# Patient Record
Sex: Female | Born: 1995
Health system: Southern US, Community
[De-identification: ages and names within clinical notes are randomized; demographics above are authoritative.]

## PROBLEM LIST (undated history)

## (undated) DIAGNOSIS — L309 Dermatitis, unspecified: Secondary | ICD-10-CM

## (undated) DIAGNOSIS — R0902 Hypoxemia: Secondary | ICD-10-CM

## (undated) HISTORY — DX: Dermatitis, unspecified: L30.9

## (undated) HISTORY — PX: WISDOM TOOTH EXTRACTION: SHX21

## (undated) HISTORY — DX: Hypoxemia: R09.02

---

## 2017-05-16 ENCOUNTER — Ambulatory Visit
Admission: EM | Admit: 2017-05-16 | Discharge: 2017-05-16 | Disposition: A | Discharge status: Home / Self Care | Payer: BLUE CROSS/BLUE SHIELD | Attending: Family Medicine | Admitting: Family Medicine

## 2017-05-16 ENCOUNTER — Encounter: Payer: Self-pay | Admitting: Emergency Medicine

## 2017-05-16 DIAGNOSIS — R05 Cough: Secondary | ICD-10-CM | POA: Diagnosis not present

## 2017-05-16 DIAGNOSIS — J0100 Acute maxillary sinusitis, unspecified: Secondary | ICD-10-CM | POA: Diagnosis not present

## 2017-05-16 MED ORDER — AMOXICILLIN 875 MG PO TABS: 875.0000 mg | ORAL_TABLET | Freq: Two times a day (BID) | ORAL | 0 refills | Status: DC

## 2017-05-16 NOTE — ED Triage Notes (Signed)
Patient c/o runny nose, cough, congestion and HAs since Monday.

## 2017-05-16 NOTE — ED Provider Notes (Signed)
[facility]    CSN: [id number] Arrival date & time: 05/16/17  1707     History   Chief Complaint Chief Complaint  Patient presents with  . Cough  . Nasal Congestion  . Headache    HPI Denise Tate is a 21 y.o. female.   The history is provided by the patient.  URI  Presenting symptoms: congestion, cough and facial pain   Severity:  Moderate Onset quality:  Sudden Duration:  1 week Timing:  Constant Progression:  Worsening Chronicity:  New Relieved by:  Nothing Ineffective treatments:  OTC medications Associated symptoms: headaches and sinus pain   Associated symptoms: no wheezing   Risk factors: sick contacts   Risk factors: not elderly, no chronic cardiac disease, no chronic kidney disease, no chronic respiratory disease, no diabetes mellitus, no immunosuppression, no recent illness and no recent travel     History reviewed. No pertinent past medical history.  There are no active problems to display for this patient.   History reviewed. No pertinent surgical history.  OB History    No data available       Home Medications    Prior to Admission medications   Medication Sig Start Date End Date Taking? Authorizing Provider  amoxicillin (AMOXIL) 875 MG tablet Take 1 tablet (875 mg total) by mouth 2 (two) times daily. 05/16/17   Payton Mccallum, MD    Family History History reviewed. No pertinent family history.  Social History Social History  Substance Use Topics  . Smoking status: Never Smoker  . Smokeless tobacco: Never Used  . Alcohol use No     Allergies   Patient has no known allergies.   Review of Systems Review of Systems  HENT: Positive for congestion and sinus pain.   Respiratory: Positive for cough. Negative for wheezing.   Neurological: Positive for headaches.     Physical Exam Triage Vital Signs ED Triage Vitals  Enc Vitals Group     BP 05/16/17 1721 102/61     Pulse Rate 05/16/17 1721 79     Resp 05/16/17 1721  14     Temp 05/16/17 1721 98.1 F (36.7 C)     Temp Source 05/16/17 1721 Oral     SpO2 05/16/17 1721 100 %     Weight 05/16/17 1719 100 lb (45.4 kg)     Height 05/16/17 1719 5' (1.524 m)     Head Circumference --      Peak Flow --      Pain Score 05/16/17 1719 0     Pain Loc --      Pain Edu? --      Excl. in GC? --    No data found.   Updated Vital Signs BP 102/61 (BP Location: Left Arm)   Pulse 79   Temp 98.1 F (36.7 C) (Oral)   Resp 14   Ht 5' (1.524 m)   Wt 100 lb (45.4 kg)   LMP 04/26/2017 (Exact Date)   SpO2 100%   BMI 19.53 kg/m   Visual Acuity Right Eye Distance:   Left Eye Distance:   Bilateral Distance:    Right Eye Near:   Left Eye Near:    Bilateral Near:     Physical Exam  Constitutional: She appears well-developed and well-nourished. No distress.  HENT:  Head: Normocephalic and atraumatic.  Right Ear: Tympanic membrane, external ear and ear canal normal.  Left Ear: Tympanic membrane, external ear and ear canal normal.  Nose: Mucosal edema  and rhinorrhea present. No nose lacerations, sinus tenderness, nasal deformity, septal deviation or nasal septal hematoma. No epistaxis.  No foreign bodies. Right sinus exhibits maxillary sinus tenderness and frontal sinus tenderness. Left sinus exhibits maxillary sinus tenderness and frontal sinus tenderness.  Mouth/Throat: Uvula is midline, oropharynx is clear and moist and mucous membranes are normal. No oropharyngeal exudate.  Eyes: Pupils are equal, round, and reactive to light. Conjunctivae and EOM are normal. Right eye exhibits no discharge. Left eye exhibits no discharge. No scleral icterus.  Neck: Normal range of motion. Neck supple. No thyromegaly present.  Cardiovascular: Normal rate, regular rhythm and normal heart sounds.   Pulmonary/Chest: Effort normal and breath sounds normal. No respiratory distress. She has no wheezes. She has no rales.  Lymphadenopathy:    She has no cervical adenopathy.  Skin:  She is not diaphoretic.  Nursing note and vitals reviewed.    UC Treatments / Results  Labs (all labs ordered are listed, but only abnormal results are displayed) Labs Reviewed - No data to display  EKG  EKG Interpretation None       Radiology No results found.  Procedures Procedures (including critical care time)  Medications Ordered in UC Medications - No data to display   Initial Impression / Assessment and Plan / UC Course  I have reviewed the triage vital signs and the nursing notes.  Pertinent labs & imaging results that were available during my care of the patient were reviewed by me and considered in my medical decision making (see chart for details).       Final Clinical Impressions(s) / UC Diagnoses   Final diagnoses:  Acute maxillary sinusitis, recurrence not specified    New Prescriptions Discharge Medication List as of 05/16/2017  5:53 PM    START taking these medications   Details  amoxicillin (AMOXIL) 875 MG tablet Take 1 tablet (875 mg total) by mouth 2 (two) times daily., Starting Fri 05/16/2017, Normal       1. diagnosis reviewed with patient 2. rx as per orders above; reviewed possible side effects, interactions, risks and benefits  3. Recommend supportive treatment with otc flonase 4. Follow-up prn if symptoms worsen or don't improve  Controlled Substance Prescriptions [place] Controlled Substance Registry consulted? Not Applicable   Payton Mccallum, MD 05/16/17 [phone]

## 2017-05-19 ENCOUNTER — Ambulatory Visit
Admission: EM | Admit: 2017-05-19 | Discharge: 2017-05-19 | Disposition: A | Discharge status: Home / Self Care | Payer: BLUE CROSS/BLUE SHIELD | Attending: Family Medicine | Admitting: Family Medicine

## 2017-05-19 ENCOUNTER — Encounter: Payer: Self-pay | Admitting: *Deleted

## 2017-05-19 DIAGNOSIS — J0100 Acute maxillary sinusitis, unspecified: Secondary | ICD-10-CM

## 2017-05-19 DIAGNOSIS — J069 Acute upper respiratory infection, unspecified: Secondary | ICD-10-CM

## 2017-05-19 NOTE — ED Triage Notes (Signed)
Patient is still having symptoms of congestion with the addition of cough since last visit on 05/16/17. Symptoms have become worse.

## 2017-05-19 NOTE — ED Provider Notes (Signed)
[facility] ____________________________________________  Time seen: Approximately 11:55 AM  I have reviewed the triage vital signs and the nursing notes.   HISTORY  Chief Complaint Cough   HPI Denise Tate is a 21 y.o. female presenting for evaluation of cough and congestion. Patient reports that she was seen in urgent care 3 days ago for the same complaint and was started on oral amoxicillin for sinusitis. Patient reports since that time she started to have more of cough and congestion continues. States still has some sinus pressure discomfort. Denies fevers, chest pain, shortness of breath, abdominal discomfort, rash or sore throat. Reports continues to eat and drink well. States total sick timeframe in one week. Has not been taking any over-the-counter medications in addition to amoxicillin. Denies other aggravating or alleviating factors.  Denies chest pain, shortness of breath, abdominal pain, dysuria,  or rash. Denies recent sickness. Denies recent antibiotic use.   Patient's last menstrual period was 04/26/2017 (exact date).Denies pregnancy.  Herb Grays, MD: PCP   History reviewed. No pertinent past medical history. denies There are no active problems to display for this patient.   History reviewed. No pertinent surgical history.   No current facility-administered medications for this encounter.   Current Outpatient Prescriptions:  .  amoxicillin (AMOXIL) 875 MG tablet, Take 1 tablet (875 mg total) by mouth 2 (two) times daily., Disp: 14 tablet, Rfl: 0  Allergies Patient has no known allergies.  History reviewed. No pertinent family history.  Social History Social History  Substance Use Topics  . Smoking status: Never Smoker  . Smokeless tobacco: Never Used  . Alcohol use No    Review of Systems Constitutional: No fever/chills Eyes: No visual changes. ENT: No sore throat. Cardiovascular: Denies chest pain. Respiratory: Denies shortness of  breath. Gastrointestinal: No abdominal pain.   Genitourinary: Negative for dysuria. Musculoskeletal: Negative for back pain. Skin: Negative for rash.   ____________________________________________   PHYSICAL EXAM:  VITAL SIGNS: ED Triage Vitals [05/19/17 1120]  Enc Vitals Group     BP (!) 103/50     Pulse Rate 84     Resp 16     Temp 98.4 F (36.9 C)     Temp Source Oral     SpO2 100 %     Weight      Height      Head Circumference      Peak Flow      Pain Score 4     Pain Loc      Pain Edu?      Excl. in GC?    Constitutional: Alert and oriented. Well appearing and in no acute distress. Eyes: Conjunctivae are normal.  Head: Atraumatic.Mild tenderness to palpation bilateral maxillary sinuses.No frontal sinus palpation.No swelling. No erythema.   Ears: no erythema, normal TMs bilaterally.   Nose: nasal congestion with bilateral nasal turbinate erythema and edema.   Mouth/Throat: Mucous membranes are moist.  Oropharynx non-erythematous.No tonsillar swelling or exudate.  Neck: No stridor.  No cervical spine tenderness to palpation. Hematological/Lymphatic/Immunilogical: No cervical lymphadenopathy. Cardiovascular: Normal rate, regular rhythm. Grossly normal heart sounds.  Good peripheral circulation. Respiratory: Normal respiratory effort.  No retractions. No wheezes, rales or rhonchi. Good air movement.  Musculoskeletal:  No cervical, thoracic or lumbar tenderness to palpation.  Neurologic:  Normal speech and language.  No gait instability. Skin:  Skin is warm, dry  Psychiatric: Mood and affect are normal. Speech and behavior are normal.  ___________________________________________   LABS (all labs ordered are  listed, but only abnormal results are displayed)  Labs Reviewed - No data to display ____________________________________________  PROCEDURES Procedures    INITIAL IMPRESSION / ASSESSMENT AND PLAN / ED COURSE  Pertinent labs & imaging results that were  available during my care of the patient were reviewed by me and considered in my medical decision making (see chart for details).  Well appearing patient. No acute distress. Discussed in detail with patient suspect viral upper respiratory infection as patient now with coughing and chest congestion. Lungs clear throughout. Encouraged patient to continue home amoxicillin prescribed for previous visit for sinusitis, patient taking over-the-counter cough and congestion medications, denies prescription for cough, states she'll take DayQuil. Encourage rest, fluids and supportive care.  Discussed follow up with Primary care physician this week. Discussed follow up and return parameters including no resolution or any worsening concerns. Patient verbalized understanding and agreed to plan.   ____________________________________________   FINAL CLINICAL IMPRESSION(S) / ED DIAGNOSES  Final diagnoses:  Upper respiratory tract infection, unspecified type  Acute maxillary sinusitis, recurrence not specified     Discharge Medication List as of 05/19/2017 12:03 PM      Note: This dictation was prepared with Dragon dictation along with smaller phrase technology. Any transcriptional errors that result from this process are unintentional.         Renford Dills, NP 05/19/17 1350

## 2017-05-19 NOTE — Discharge Instructions (Signed)
Take medication as prescribed. Rest. Drink plenty of fluids.  ° °Follow up with your primary care physician this week as needed. Return to Urgent care for new or worsening concerns.  ° °

## 2017-06-23 ENCOUNTER — Ambulatory Visit (INDEPENDENT_AMBULATORY_CARE_PROVIDER_SITE_OTHER): Payer: BLUE CROSS/BLUE SHIELD | Admitting: Family Medicine

## 2017-06-23 ENCOUNTER — Encounter: Payer: Self-pay | Admitting: Family Medicine

## 2017-06-23 VITALS — BP 101/64 | HR 96 | Temp 98.4°F | Ht 61.0 in | Wt 99.4 lb

## 2017-06-23 DIAGNOSIS — L309 Dermatitis, unspecified: Secondary | ICD-10-CM

## 2017-06-23 DIAGNOSIS — R0789 Other chest pain: Secondary | ICD-10-CM | POA: Diagnosis not present

## 2017-06-23 DIAGNOSIS — J4520 Mild intermittent asthma, uncomplicated: Secondary | ICD-10-CM

## 2017-06-23 DIAGNOSIS — B373 Candidiasis of vulva and vagina: Secondary | ICD-10-CM | POA: Diagnosis not present

## 2017-06-23 DIAGNOSIS — R140 Abdominal distension (gaseous): Secondary | ICD-10-CM | POA: Diagnosis not present

## 2017-06-23 DIAGNOSIS — Z7689 Persons encountering health services in other specified circumstances: Secondary | ICD-10-CM | POA: Diagnosis not present

## 2017-06-23 DIAGNOSIS — J45909 Unspecified asthma, uncomplicated: Secondary | ICD-10-CM | POA: Insufficient documentation

## 2017-06-23 DIAGNOSIS — B3731 Acute candidiasis of vulva and vagina: Secondary | ICD-10-CM

## 2017-06-23 MED ORDER — ALBUTEROL SULFATE (2.5 MG/3ML) 0.083% IN NEBU
2.5000 mg | INHALATION_SOLUTION | Freq: Once | RESPIRATORY_TRACT | Status: AC
  Administered 2017-06-23: 2.5 mg via RESPIRATORY_TRACT

## 2017-06-23 MED ORDER — FLUCONAZOLE 150 MG PO TABS: 150.0000 mg | ORAL_TABLET | Freq: Once | ORAL | 0 refills | Status: AC

## 2017-06-23 MED ORDER — ALBUTEROL SULFATE HFA 108 (90 BASE) MCG/ACT IN AERS: 2.0000 | INHALATION_SPRAY | Freq: Four times a day (QID) | RESPIRATORY_TRACT | 3 refills | Status: DC | PRN

## 2017-06-23 NOTE — Progress Notes (Signed)
BP 101/64 (BP Location: Right Arm, Patient Position: Sitting, Cuff Size: Small)   Pulse 96   Temp 98.4 F (36.9 C)   Ht 5\' 1"  (1.549 m)   Wt 99 lb 6.4 oz (45.1 kg)   SpO2 99%   BMI 18.78 kg/m    Subjective:    Patient ID: Denise Tate, female    DOB: 1995/12/07, 21 y.o.   MRN: [id number]  HPI: Denise Tate is a 21 y.o. female  Chief Complaint  Patient presents with  . Establish Care  . Vaginal Discharge    Started Thursday. Patient was taking Amoxicillan.   . Vaginal Itching   Patient presents today to establish care. Has several concerns today.   Recently treated with amoxicillin for URI, now having several days of vaginal discharge and itching. Frequently gets yeast infections with abx. Has tried some monistat with no relief.   Has always had chest tightness, father has asthma but she has never been tested. Not aware of any seasonal allergies. Has never tried anything for her sxs.   Does have severe eczema for which she is followed by dermatology regularly. On several topical creams for this. Not currently doing a moisturizing regimen.   Also experiencing some gassiness and abdominal bloating, worse after eating.   Past Medical History:  Diagnosis Date  . Eczema    Social History   Social History  . Marital status: Single    Spouse name: N/A  . Number of children: N/A  . Years of education: N/A   Occupational History  . Not on file.   Social History Main Topics  . Smoking status: Never Smoker  . Smokeless tobacco: Never Used  . Alcohol use No  . Drug use: No  . Sexual activity: Not on file   Other Topics Concern  . Not on file   Social History Narrative  . No narrative on file    Relevant past medical, surgical, family and social history reviewed and updated as indicated. Interim medical history since our last visit reviewed. Allergies and medications reviewed and updated.  Review of Systems  Constitutional: Negative.   HENT: Negative.     Respiratory: Positive for chest tightness.   Cardiovascular: Negative.   Gastrointestinal: Positive for abdominal pain.  Genitourinary: Positive for vaginal discharge.  Musculoskeletal: Negative.   Neurological: Negative.   Psychiatric/Behavioral: Negative.    Per HPI unless specifically indicated above     Objective:    BP 101/64 (BP Location: Right Arm, Patient Position: Sitting, Cuff Size: Small)   Pulse 96   Temp 98.4 F (36.9 C)   Ht 5\' 1"  (1.549 m)   Wt 99 lb 6.4 oz (45.1 kg)   SpO2 99%   BMI 18.78 kg/m   Wt Readings from Last 3 Encounters:  06/23/17 99 lb 6.4 oz (45.1 kg)  05/16/17 100 lb (45.4 kg)    Physical Exam  Constitutional: She is oriented to person, place, and time. She appears well-developed and well-nourished.  HENT:  Head: Atraumatic.  Eyes: Pupils are equal, round, and reactive to light. Conjunctivae are normal.  Neck: Normal range of motion. Neck supple.  Cardiovascular: Normal rate and normal heart sounds.   Pulmonary/Chest: Effort normal and breath sounds normal.  Abdominal: Soft. Bowel sounds are normal. She exhibits no distension. There is no tenderness.  Genitourinary:  Genitourinary Comments: Exam declined  Musculoskeletal: Normal range of motion.  Neurological: She is alert and oriented to person, place, and time.  Skin: Skin is warm  and dry.  Psychiatric: She has a normal mood and affect. Her behavior is normal.  Nursing note and vitals reviewed.   No results found for this or any previous visit.    Assessment & Plan:   Problem List Items Addressed This Visit      Respiratory   Asthma    Diagnosed today via spirometry as well as with patient hx. Will start albuterol inhaler prn. F/u if worsening sxs. Instructions on inhaler use technique today      Relevant Medications   albuterol (PROVENTIL) (2.5 MG/3ML) 0.083% nebulizer solution 2.5 mg (Completed)   albuterol (PROVENTIL HFA;VENTOLIN HFA) 108 (90 Base) MCG/ACT inhaler      Musculoskeletal and Integument   Eczema - Primary    Followed by dermatology, continue current regimen. Discussed at length about BID moisturizing cream over body, unscented soaps, avoiding hot water.        Other Visit Diagnoses    Encounter to establish care       Abdominal bloating       Start probiotic supplement, avoid trigger foods. Discussed starting bentyl, pt wanting to hold off for now.    Vaginal candidiasis       Will treat with oral diflucan. F/u if no improvement   Chest tightness       Spirometry reveals asthma pattern that is improved after nebulizer treatment. Will start prn albuterol inhaler.    Relevant Medications   albuterol (PROVENTIL) (2.5 MG/3ML) 0.083% nebulizer solution 2.5 mg (Completed)   Other Relevant Orders   Spirometry with Graph (Completed)   PR DEMO &/OR EVAL,PT USE,AEROSOL DEVICE       Follow up plan: Return for CPE.

## 2017-06-25 NOTE — Assessment & Plan Note (Signed)
Diagnosed today via spirometry as well as with patient hx. Will start albuterol inhaler prn. F/u if worsening sxs. Instructions on inhaler use technique today

## 2017-06-25 NOTE — Assessment & Plan Note (Signed)
Followed by dermatology, continue current regimen. Discussed at length about BID moisturizing cream over body, unscented soaps, avoiding hot water.

## 2017-06-25 NOTE — Patient Instructions (Signed)
Follow up for CPE 

## 2017-08-07 ENCOUNTER — Ambulatory Visit: Payer: BLUE CROSS/BLUE SHIELD | Admitting: Family Medicine

## 2017-08-07 ENCOUNTER — Encounter: Payer: Self-pay | Admitting: Family Medicine

## 2017-08-07 VITALS — BP 96/61 | HR 72 | Temp 97.9°F | Ht 60.0 in | Wt 108.3 lb

## 2017-08-07 DIAGNOSIS — Z30011 Encounter for initial prescription of contraceptive pills: Secondary | ICD-10-CM | POA: Diagnosis not present

## 2017-08-07 DIAGNOSIS — N946 Dysmenorrhea, unspecified: Secondary | ICD-10-CM | POA: Diagnosis not present

## 2017-08-07 LAB — PREGNANCY, URINE: PREG TEST UR: NEGATIVE

## 2017-08-07 MED ORDER — NORETHINDRONE ACET-ETHINYL EST 1-20 MG-MCG PO TABS: 1.0000 | ORAL_TABLET | Freq: Every day | ORAL | 11 refills | Status: DC

## 2017-08-10 NOTE — Patient Instructions (Signed)
Follow up as needed

## 2017-08-10 NOTE — Progress Notes (Signed)
BP 96/61 (BP Location: Left Arm, Patient Position: Sitting, Cuff Size: Small)   Pulse 72   Temp 97.9 F (36.6 C) (Temporal)   Ht 5' (1.524 m)   Wt 108 lb 4.8 oz (49.1 kg)   SpO2 99%   BMI 21.15 kg/m    Subjective:    Patient ID: Denise Tate, female    DOB: 05/07/1996, 21 y.o.   MRN: [id number]  HPI: Denise Tate is a 21 y.o. female  Chief Complaint  Patient presents with  . Menstrual Problem    Bad cramps during menstrual. Will miss work or school due to them. Menstrual lasts about 6 days, First day is usually the worst.  . Dysmenorrhea    Patient would like to talk about Birth Control to help control menstrual/cramps.   Significant cramping during first 2 days of period with heavy bleeding. This has been ongoing since onset of periods at age 21. Periods are relatively regular, usually within 4 days of each other each month. Currently sexually active, unprotected, uses pull out method for contraception. Interested in starting birth control pills to help lessen her sxs. Denies hx of migraines, blood clots, fhx of breast cancer.   Relevant past medical, surgical, family and social history reviewed and updated as indicated. Interim medical history since our last visit reviewed. Allergies and medications reviewed and updated.  Review of Systems  Constitutional: Negative.   HENT: Negative.   Eyes: Negative.   Respiratory: Negative.   Cardiovascular: Negative.   Gastrointestinal: Negative.   Genitourinary: Positive for menstrual problem.  Musculoskeletal: Negative.   Neurological: Negative.   Psychiatric/Behavioral: Negative.    Per HPI unless specifically indicated above     Objective:    BP 96/61 (BP Location: Left Arm, Patient Position: Sitting, Cuff Size: Small)   Pulse 72   Temp 97.9 F (36.6 C) (Temporal)   Ht 5' (1.524 m)   Wt 108 lb 4.8 oz (49.1 kg)   SpO2 99%   BMI 21.15 kg/m   Wt Readings from Last 3 Encounters:  08/07/17 108 lb 4.8 oz (49.1 kg)    06/23/17 99 lb 6.4 oz (45.1 kg)  05/16/17 100 lb (45.4 kg)    Physical Exam  Constitutional: She is oriented to person, place, and time. She appears well-developed and well-nourished. No distress.  HENT:  Head: Atraumatic.  Eyes: Conjunctivae are normal. Pupils are equal, round, and reactive to light.  Neck: Normal range of motion. Neck supple.  Cardiovascular: Normal rate and normal heart sounds.  Pulmonary/Chest: Effort normal and breath sounds normal.  Abdominal: Soft. Bowel sounds are normal. She exhibits no distension. There is no tenderness.  Musculoskeletal: Normal range of motion.  Neurological: She is alert and oriented to person, place, and time.  Skin: Skin is warm and dry.  Psychiatric: She has a normal mood and affect. Her behavior is normal.  Nursing note and vitals reviewed.  Results for orders placed or performed in visit on 08/07/17  Pregnancy, urine  Result Value Ref Range   Preg Test, Ur Negative Negative      Assessment & Plan:   Problem List Items Addressed This Visit    None    Visit Diagnoses    Dysmenorrhea    -  Primary   Discussed options at length, pt opting for birth control pills. Will refer for further workup if no improvement on this regimen. Continue OTC pain relievers prn   Encounter for initial prescription of contraceptive pills  Urine preg negative, discsused proper use and risks/precautions. F/u if side effects or no improvement of dysmenorrhea.    Relevant Orders   Pregnancy, urine (Completed)       Follow up plan: Return if symptoms worsen or fail to improve.

## 2018-02-23 ENCOUNTER — Encounter: Payer: Self-pay | Admitting: Family Medicine

## 2018-02-23 ENCOUNTER — Ambulatory Visit: Payer: BLUE CROSS/BLUE SHIELD | Admitting: Family Medicine

## 2018-02-23 VITALS — BP 104/65 | HR 80 | Temp 98.9°F | Ht 60.0 in | Wt 106.0 lb

## 2018-02-23 DIAGNOSIS — N946 Dysmenorrhea, unspecified: Secondary | ICD-10-CM | POA: Diagnosis not present

## 2018-02-23 DIAGNOSIS — R350 Frequency of micturition: Secondary | ICD-10-CM | POA: Diagnosis not present

## 2018-02-23 DIAGNOSIS — R5383 Other fatigue: Secondary | ICD-10-CM | POA: Diagnosis not present

## 2018-02-23 NOTE — Patient Instructions (Addendum)
Norethindrone tablets (contraception) What is this medicine? NORETHINDRONE (nor eth IN drone) is an oral contraceptive. The product contains a female hormone known as a progestin. It is used to prevent pregnancy. This medicine may be used for other purposes; ask your health care provider or pharmacist if you have questions. COMMON BRAND NAME(S): Camila, Deblitane 28-Day, Errin, Heather, [place], Joliv[place], [place], Nor-QD, Nora-BE, Norlyroc, [facility], [employer] 28-Day What should I tell my health care provider before I take this medicine? They need to know if you have any of these conditions: -blood vessel disease or blood clots -breast, cervical, or vaginal cancer -diabetes -heart disease -kidney disease -liver disease -mental depression -migraine -seizures -stroke -vaginal bleeding -an unusual or allergic reaction to norethindrone, other medicines, foods, dyes, or preservatives -pregnant or trying to get pregnant -breast-feeding How should I use this medicine? Take this medicine by mouth with a glass of water. You may take it with or without food. Follow the directions on the prescription label. Take this medicine at the same time each day and in the order directed on the package. Do not take your medicine more often than directed. Contact your pediatrician regarding the use of this medicine in children. Special care may be needed. This medicine has been used in female children who have started having menstrual periods. A patient package insert for the product will be given with each prescription and refill. Read this sheet carefully each time. The sheet may change frequently. Overdosage: If you think you have taken too much of this medicine contact a poison control center or emergency room at once. NOTE: This medicine is only for you. Do not share this medicine with others. What if I miss a dose? Try not to miss a dose. Every time you miss a dose or take a dose late your chance of  pregnancy increases. When 1 pill is missed (even if only 3 hours late), take the missed pill as soon as possible and continue taking a pill each day at the regular time (use a back up method of birth control for the next 48 hours). If more than 1 dose is missed, use an additional birth control method for the rest of your pill pack until menses occurs. Contact your health care professional if more than 1 dose has been missed. What may interact with this medicine? Do not take this medicine with any of the following medications: -amprenavir or fosamprenavir -bosentan This medicine may also interact with the following medications: -antibiotics or medicines for infections, especially rifampin, rifabutin, rifapentine, and griseofulvin, and possibly penicillins or tetracyclines -aprepitant -barbiturate medicines, such as phenobarbital -carbamazepine -felbamate -modafinil -oxcarbazepine -phenytoin -ritonavir or other medicines for HIV infection or AIDS -St. John's wort -topiramate This list may not describe all possible interactions. Give your health care provider a list of all the medicines, herbs, non-prescription drugs, or dietary supplements you use. Also tell them if you smoke, drink alcohol, or use illegal drugs. Some items may interact with your medicine. What should I watch for while using this medicine? Visit your doctor or health care professional for regular checks on your progress. You will need a regular breast and pelvic exam and Pap smear while on this medicine. Use an additional method of birth control during the first cycle that you take these tablets. If you have any reason to think you are pregnant, stop taking this medicine right away and contact your doctor or health care professional. If you are taking this medicine for hormone related problems, it  may take several cycles of use to see improvement in your condition. This medicine does not protect you against HIV infection (AIDS)  or any other sexually transmitted diseases. What side effects may I notice from receiving this medicine? Side effects that you should report to your doctor or health care professional as soon as possible: -breast tenderness or discharge -pain in the abdomen, chest, groin or leg -severe headache -skin rash, itching, or hives -sudden shortness of breath -unusually weak or tired -vision or speech problems -yellowing of skin or eyes Side effects that usually do not require medical attention (report to your doctor or health care professional if they continue or are bothersome): -changes in sexual desire -change in menstrual flow -facial hair growth -fluid retention and swelling -headache -irritability -nausea -weight gain or loss This list may not describe all possible side effects. Call your doctor for medical advice about side effects. You may report side effects to FDA at 1-800-FDA-1088. Where should I keep my medicine? Keep out of the reach of children. Store at room temperature between 15 and 30 degrees C (59 and 86 degrees F). Throw away any unused medicine after the expiration date. NOTE: This sheet is a summary. It may not cover all possible information. If you have questions about this medicine, talk to your doctor, pharmacist, or health care provider.  2018 Elsevier/Gold Standard (2012-05-01 16:41:35)   Ethinyl Estradiol; Etonogestrel vaginal ring What is this medicine? ETHINYL ESTRADIOL; ETONOGESTREL (ETH in il es tra DYE ole; et oh noe JES trel) vaginal ring is a flexible, vaginal ring used as a contraceptive (birth control method). This medicine combines two types of female hormones, an estrogen and a progestin. This ring is used to prevent ovulation and pregnancy. Each ring is effective for one month. This medicine may be used for other purposes; ask your health care provider or pharmacist if you have questions. COMMON BRAND NAME(S): NuvaRing What should I tell my health  care provider before I take this medicine? They need to know if you have or ever had any of these conditions: -abnormal vaginal bleeding -blood vessel disease or blood clots -breast, cervical, endometrial, ovarian, liver, or uterine cancer -diabetes -gallbladder disease -heart disease or recent heart attack -high blood pressure -high cholesterol -kidney disease -liver disease -migraine headaches -stroke -systemic lupus erythematosus (SLE) -tobacco smoker -an unusual or allergic reaction to estrogens, progestins, other medicines, foods, dyes, or preservatives -pregnant or trying to get pregnant -breast-feeding How should I use this medicine? Insert the ring into your vagina as directed. Follow the directions on the prescription label. The ring will remain place for 3 weeks and is then removed for a 1-week break. A new ring is inserted 1 week after the last ring was removed, on the same day of the week. Check often to make sure the ring is still in place, especially before and after sexual intercourse. If the ring was out of the vagina for an unknown amount of time, you may not be protected from pregnancy. Perform a pregnancy test and call your doctor. Do not use more often than directed. A patient package insert for the product will be given with each prescription and refill. Read this sheet carefully each time. The sheet may change frequently. Contact your pediatrician regarding the use of this medicine in children. Special care may be needed. This medicine has been used in female children who have started having menstrual periods. Overdosage: If you think you have taken too much of this medicine contact  a poison control center or emergency room at once. NOTE: This medicine is only for you. Do not share this medicine with others. What if I miss a dose? You will need to replace your vaginal ring once a month as directed. If the ring should slip out, or if you leave it in longer or shorter  than you should, contact your health care professional for advice. What may interact with this medicine? Do not take this medicine with the following medication: -dasabuvir; ombitasvir; paritaprevir; ritonavir -ombitasvir; paritaprevir; ritonavir This medicine may also interact with the following medications: -acetaminophen -antibiotics or medicines for infections, especially rifampin, rifabutin, rifapentine, and griseofulvin, and possibly penicillins or tetracyclines -aprepitant -ascorbic acid (vitamin C) -atorvastatin -barbiturate medicines, such as phenobarbital -bosentan -carbamazepine -caffeine -clofibrate -cyclosporine -dantrolene -doxercalciferol -felbamate -grapefruit juice -hydrocortisone -medicines for anxiety or sleeping problems, such as diazepam or temazepam -medicines for diabetes, including pioglitazone -modafinil -mycophenolate -nefazodone -oxcarbazepine -phenytoin -prednisolone -ritonavir or other medicines for HIV infection or AIDS -rosuvastatin -selegiline -soy isoflavones supplements -St. John's wort -tamoxifen or raloxifene -theophylline -thyroid hormones -topiramate -warfarin This list may not describe all possible interactions. Give your health care provider a list of all the medicines, herbs, non-prescription drugs, or dietary supplements you use. Also tell them if you smoke, drink alcohol, or use illegal drugs. Some items may interact with your medicine. What should I watch for while using this medicine? Visit your doctor or health care professional for regular checks on your progress. You will need a regular breast and pelvic exam and Pap smear while on this medicine. Use an additional method of contraception during the first cycle that you use this ring. Do not use a diaphragm or female condom, as the ring can interfere with these birth control methods and their proper placement. If you have any reason to think you are pregnant, stop using this  medicine right away and contact your doctor or health care professional. If you are using this medicine for hormone related problems, it may take several cycles of use to see improvement in your condition. Smoking increases the risk of getting a blood clot or having a stroke while you are using hormonal birth control, especially if you are more than 22 years old. You are strongly advised not to smoke. This medicine can make your body retain fluid, making your fingers, hands, or ankles swell. Your blood pressure can go up. Contact your doctor or health care professional if you feel you are retaining fluid. This medicine can make you more sensitive to the sun. Keep out of the sun. If you cannot avoid being in the sun, wear protective clothing and use sunscreen. Do not use sun lamps or tanning beds/booths. If you wear contact lenses and notice visual changes, or if the lenses begin to feel uncomfortable, consult your eye care specialist. In some women, tenderness, swelling, or minor bleeding of the gums may occur. Notify your dentist if this happens. Brushing and flossing your teeth regularly may help limit this. See your dentist regularly and inform your dentist of the medicines you are taking. If you are going to have elective surgery, you may need to stop using this medicine before the surgery. Consult your health care professional for advice. This medicine does not protect you against HIV infection (AIDS) or any other sexually transmitted diseases. What side effects may I notice from receiving this medicine? Side effects that you should report to your doctor or health care professional as soon as possible: -breast tissue changes or  discharge -changes in vaginal bleeding during your period or between your periods -chest pain -coughing up blood -dizziness or fainting spells -headaches or migraines -leg, arm or groin pain -severe or sudden headaches -stomach pain (severe) -sudden shortness of  breath -sudden loss of coordination, especially on one side of the body -speech problems -symptoms of vaginal infection like itching, irritation or unusual discharge -tenderness in the upper abdomen -vomiting -weakness or numbness in the arms or legs, especially on one side of the body -yellowing of the eyes or skin Side effects that usually do not require medical attention (report to your doctor or health care professional if they continue or are bothersome): -breakthrough bleeding and spotting that continues beyond the 3 initial cycles of pills -breast tenderness -mood changes, anxiety, depression, frustration, anger, or emotional outbursts -increased sensitivity to sun or ultraviolet light -nausea -skin rash, acne, or brown spots on the skin -weight gain (slight) This list may not describe all possible side effects. Call your doctor for medical advice about side effects. You may report side effects to FDA at 1-800-FDA-1088. Where should I keep my medicine? Keep out of the reach of children. Store at room temperature between 15 and 30 degrees C (59 and 86 degrees F) for up to 4 months. The product will expire after 4 months. Protect from light. Throw away any unused medicine after the expiration date. NOTE: This sheet is a summary. It may not cover all possible information. If you have questions about this medicine, talk to your doctor, pharmacist, or health care provider.  2018 Elsevier/Gold Standard (2016-04-19 17:00:31)   Medroxyprogesterone injection [Contraceptive] What is this medicine? MEDROXYPROGESTERONE (me DROX ee proe JES te rone) contraceptive injections prevent pregnancy. They provide effective birth control for 3 months. Depo-subQ Provera 104 is also used for treating pain related to endometriosis. This medicine may be used for other purposes; ask your health care provider or pharmacist if you have questions. COMMON BRAND NAME(S): Depo-Provera, Depo-subQ Provera 104 What  should I tell my health care provider before I take this medicine? They need to know if you have any of these conditions: -frequently drink alcohol -asthma -blood vessel disease or a history of a blood clot in the lungs or legs -bone disease such as osteoporosis -breast cancer -diabetes -eating disorder (anorexia nervosa or bulimia) -high blood pressure -HIV infection or AIDS -kidney disease -liver disease -mental depression -migraine -seizures (convulsions) -stroke -tobacco smoker -vaginal bleeding -an unusual or allergic reaction to medroxyprogesterone, other hormones, medicines, foods, dyes, or preservatives -pregnant or trying to get pregnant -breast-feeding How should I use this medicine? Depo-Provera Contraceptive injection is given into a muscle. Depo-subQ Provera 104 injection is given under the skin. These injections are given by a health care professional. You must not be pregnant before getting an injection. The injection is usually given during the first 5 days after the start of a menstrual period or 6 weeks after delivery of a baby. Talk to your pediatrician regarding the use of this medicine in children. Special care may be needed. These injections have been used in female children who have started having menstrual periods. Overdosage: If you think you have taken too much of this medicine contact a poison control center or emergency room at once. NOTE: This medicine is only for you. Do not share this medicine with others. What if I miss a dose? Try not to miss a dose. You must get an injection once every 3 months to maintain birth control. If you cannot  keep an appointment, call and reschedule it. If you wait longer than 13 weeks between Depo-Provera contraceptive injections or longer than 14 weeks between Depo-subQ Provera 104 injections, you could get pregnant. Use another method for birth control if you miss your appointment. You may also need a pregnancy test before  receiving another injection. What may interact with this medicine? Do not take this medicine with any of the following medications: -bosentan This medicine may also interact with the following medications: -aminoglutethimide -antibiotics or medicines for infections, especially rifampin, rifabutin, rifapentine, and griseofulvin -aprepitant -barbiturate medicines such as phenobarbital or primidone -bexarotene -carbamazepine -medicines for seizures like ethotoin, felbamate, oxcarbazepine, phenytoin, topiramate -modafinil -St. John's wort This list may not describe all possible interactions. Give your health care provider a list of all the medicines, herbs, non-prescription drugs, or dietary supplements you use. Also tell them if you smoke, drink alcohol, or use illegal drugs. Some items may interact with your medicine. What should I watch for while using this medicine? This drug does not protect you against HIV infection (AIDS) or other sexually transmitted diseases. Use of this product may cause you to lose calcium from your bones. Loss of calcium may cause weak bones (osteoporosis). Only use this product for more than 2 years if other forms of birth control are not right for you. The longer you use this product for birth control the more likely you will be at risk for weak bones. Ask your health care professional how you can keep strong bones. You may have a change in bleeding pattern or irregular periods. Many females stop having periods while taking this drug. If you have received your injections on time, your chance of being pregnant is very low. If you think you may be pregnant, see your health care professional as soon as possible. Tell your health care professional if you want to get pregnant within the next year. The effect of this medicine may last a long time after you get your last injection. What side effects may I notice from receiving this medicine? Side effects that you should  report to your doctor or health care professional as soon as possible: -allergic reactions like skin rash, itching or hives, swelling of the face, lips, or tongue -breast tenderness or discharge -breathing problems -changes in vision -depression -feeling faint or lightheaded, falls -fever -pain in the abdomen, chest, groin, or leg -problems with balance, talking, walking -unusually weak or tired -yellowing of the eyes or skin Side effects that usually do not require medical attention (report to your doctor or health care professional if they continue or are bothersome): -acne -fluid retention and swelling -headache -irregular periods, spotting, or absent periods -temporary pain, itching, or skin reaction at site where injected -weight gain This list may not describe all possible side effects. Call your doctor for medical advice about side effects. You may report side effects to FDA at 1-800-FDA-1088. Where should I keep my medicine? This does not apply. The injection will be given to you by a health care professional. NOTE: This sheet is a summary. It may not cover all possible information. If you have questions about this medicine, talk to your doctor, pharmacist, or health care provider.  2018 Elsevier/Gold Standard (2008-09-02 18:37:56)  Call once you've decided what you'd like to do about birth control

## 2018-02-23 NOTE — Progress Notes (Signed)
BP 104/65 (BP Location: Right Arm, Patient Position: Sitting, Cuff Size: Normal)   Pulse 80   Temp 98.9 F (37.2 C) (Oral)   Ht 5' (1.524 m)   Wt 106 lb (48.1 kg)   SpO2 100%   BMI 20.70 kg/m    Subjective:    Patient ID: Denise Tate, female    DOB: 12-Dec-1995, 22 y.o.   MRN: [id number]  HPI: Denise Tate is a 22 y.o. female  Chief Complaint  Patient presents with  . Menstrual Problem    Patient states her mentural usually only lasts 2 days and it's last 3 days, with more heavy flow and some cramping.   Pt here today to discuss some concerns about her menstrual cycle. Was started on oral contraceptives about 6 months ago to help with significant cramping and moderate bleeding with fairly good results until current cycle. States bleeding has lasted over a day longer than usual and cramping is stronger than it had been since starting contraceptives. Also very concerned that she seems to have lost her sex drive since starting the medicine. Denies uncontrollable bleeding, large clots, dizziness, pelvic pain.   Also experiencing fatigue the past few weeks and wondering if it's related to her d/c of several cups of tea daily. Denies fevers, body aches, anorexia, recent illness. Sleeping fairly well. States it isn't severe to where she is falling asleep at inappropriate times.   Was previously treated 2-3 years ago for eczema with some type of oral medication that she can't remember now, states since taking it she's had urinary urgency and frequency. States she has to urinate just about every 2 hours and doesn't know why it's still happening. Denies incontinence, dysuria, hematuria, pelvic pain, vaginal sxs. No hx of UTIs that she's aware of.   Relevant past medical, surgical, family and social history reviewed and updated as indicated. Interim medical history since our last visit reviewed. Allergies and medications reviewed and updated.  Review of Systems  Per HPI unless specifically  indicated above     Objective:    BP 104/65 (BP Location: Right Arm, Patient Position: Sitting, Cuff Size: Normal)   Pulse 80   Temp 98.9 F (37.2 C) (Oral)   Ht 5' (1.524 m)   Wt 106 lb (48.1 kg)   SpO2 100%   BMI 20.70 kg/m   Wt Readings from Last 3 Encounters:  02/23/18 106 lb (48.1 kg)  08/07/17 108 lb 4.8 oz (49.1 kg)  06/23/17 99 lb 6.4 oz (45.1 kg)    Physical Exam  Constitutional: She is oriented to person, place, and time. She appears well-developed and well-nourished. No distress.  HENT:  Head: Atraumatic.  Eyes: Pupils are equal, round, and reactive to light. Conjunctivae are normal.  Neck: Normal range of motion. Neck supple.  Cardiovascular: Normal rate and regular rhythm.  Pulmonary/Chest: Effort normal and breath sounds normal.  Abdominal: Soft. Bowel sounds are normal. She exhibits no distension. There is no tenderness.  Musculoskeletal: Normal range of motion.  Neurological: She is alert and oriented to person, place, and time.  Skin: Skin is warm and dry.  Psychiatric: She has a normal mood and affect. Her behavior is normal.  Nursing note and vitals reviewed.   Results for orders placed or performed in visit on 02/23/18  Microscopic Examination  Result Value Ref Range   WBC, UA 0-5 0 - 5 /hpf   RBC, UA 0-2 0 - 2 /hpf   Epithelial Cells (non renal) 0-10 0 -  10 /hpf   Mucus, UA Present Not Estab.   Bacteria, UA None seen None seen/Few  UA/M w/rflx Culture, Routine  Result Value Ref Range   Specific Gravity, UA >1.030 (H) 1.005 - 1.030   pH, UA 5.0 5.0 - 7.5   Color, UA Yellow Yellow   Appearance Ur Cloudy (A) Clear   Leukocytes, UA Negative Negative   Protein, UA Negative Negative/Trace   Glucose, UA Negative Negative   Ketones, UA Negative Negative   RBC, UA 3+ (A) Negative   Bilirubin, UA Negative Negative   Urobilinogen, Ur 0.2 0.2 - 1.0 mg/dL   Nitrite, UA Negative Negative   Microscopic Examination See below:       Assessment & Plan:     Problem List Items Addressed This Visit    None    Visit Diagnoses    Menstrual cramps    -  Primary   Discussed options given side effects to current tx, print outs given about IUD, nuva ring, mini pill and pt will consider. Continue current regimen for now   Urinary frequency       Unclear what medication was. U/A neg for infection and pt's sxs mild and minimally concerning to her. Will cont to monitor for now   Relevant Orders   UA/M w/rflx Culture, Routine (Completed)   Other fatigue       Sxs mild and potentially related to d/c of caffeine. Discussed good diet and exercise, healthy sleep habits, and starting multivitamin to help with energy level       Follow up plan: Return for CPE.

## 2018-02-24 LAB — UA/M W/RFLX CULTURE, ROUTINE
Bilirubin, UA: NEGATIVE
GLUCOSE, UA: NEGATIVE
KETONES UA: NEGATIVE
LEUKOCYTES UA: NEGATIVE
Nitrite, UA: NEGATIVE
Protein, UA: NEGATIVE
Urobilinogen, Ur: 0.2 mg/dL (ref 0.2–1.0)
pH, UA: 5 (ref 5.0–7.5)

## 2018-02-24 LAB — MICROSCOPIC EXAMINATION: BACTERIA UA: NONE SEEN

## 2018-03-05 ENCOUNTER — Ambulatory Visit: Payer: BLUE CROSS/BLUE SHIELD | Admitting: Family Medicine

## 2018-03-13 NOTE — Progress Notes (Signed)
Needs CPE, pap and chlamydia screen.

## 2018-05-25 ENCOUNTER — Ambulatory Visit: Payer: BLUE CROSS/BLUE SHIELD | Admitting: Physician Assistant

## 2018-08-21 ENCOUNTER — Ambulatory Visit: Payer: BLUE CROSS/BLUE SHIELD | Admitting: Family Medicine

## 2018-08-28 ENCOUNTER — Ambulatory Visit: Payer: BLUE CROSS/BLUE SHIELD | Admitting: Family Medicine

## 2018-08-28 ENCOUNTER — Encounter: Payer: Self-pay | Admitting: Family Medicine

## 2018-08-28 ENCOUNTER — Other Ambulatory Visit (HOSPITAL_COMMUNITY)
Admission: RE | Admit: 2018-08-28 | Discharge: 2018-08-28 | Disposition: A | Discharge status: Home / Self Care | Payer: BLUE CROSS/BLUE SHIELD | Source: Ambulatory Visit | Attending: Family Medicine | Admitting: Family Medicine

## 2018-08-28 VITALS — BP 100/67 | HR 79 | Temp 98.7°F | Wt 97.3 lb

## 2018-08-28 DIAGNOSIS — Z113 Encounter for screening for infections with a predominantly sexual mode of transmission: Secondary | ICD-10-CM

## 2018-08-28 DIAGNOSIS — Z124 Encounter for screening for malignant neoplasm of cervix: Secondary | ICD-10-CM | POA: Diagnosis not present

## 2018-08-28 NOTE — Progress Notes (Signed)
BP 100/67   Pulse 79   Temp 98.7 F (37.1 C) (Oral)   Wt 97 lb 4.8 oz (44.1 kg)   LMP 08/20/2018 (Exact Date)   SpO2 98%   BMI 19.00 kg/m    Subjective:    Patient ID: Denise Tate, female    DOB: 15-Nov-1995, 23 y.o.   MRN: [id number]  HPI: Denise Tate is a 23 y.o. female  Chief Complaint  Patient presents with  . SEXUALLY TRANSMITTED DISEASE    pt states she wants to be checked for STD's and have a PAP smear    Here today requesting a pap smear and STI screening. Not currently on any contraception, quit taking contraception 3 months ago. Does not want to be back on. No concerns for pregnancy per patient. LMP was 08/20/18. No abnormal bleeding, lesions, dysuria. Had some discharge a few weeks ago but that has since improved.   Relevant past medical, surgical, family and social history reviewed and updated as indicated. Interim medical history since our last visit reviewed. Allergies and medications reviewed and updated.  Review of Systems  Per HPI unless specifically indicated above     Objective:    BP 100/67   Pulse 79   Temp 98.7 F (37.1 C) (Oral)   Wt 97 lb 4.8 oz (44.1 kg)   LMP 08/20/2018 (Exact Date)   SpO2 98%   BMI 19.00 kg/m   Wt Readings from Last 3 Encounters:  08/28/18 97 lb 4.8 oz (44.1 kg)  02/23/18 106 lb (48.1 kg)  08/07/17 108 lb 4.8 oz (49.1 kg)    Physical Exam Vitals signs and nursing note reviewed.  Constitutional:      Appearance: Normal appearance. She is not ill-appearing.  HENT:     Head: Atraumatic.  Eyes:     Extraocular Movements: Extraocular movements intact.     Conjunctiva/sclera: Conjunctivae normal.  Neck:     Musculoskeletal: Normal range of motion and neck supple.  Cardiovascular:     Rate and Rhythm: Normal rate and regular rhythm.     Heart sounds: Normal heart sounds.  Pulmonary:     Effort: Pulmonary effort is normal.     Breath sounds: Normal breath sounds.  Genitourinary:    General: Normal vulva.   Vagina: No vaginal discharge.  Musculoskeletal: Normal range of motion.  Skin:    General: Skin is warm and dry.  Neurological:     Mental Status: She is alert and oriented to person, place, and time.  Psychiatric:        Mood and Affect: Mood normal.        Thought Content: Thought content normal.        Judgment: Judgment normal.     Results for orders placed or performed in visit on 08/28/18  HIV Antibody (routine testing w rflx)  Result Value Ref Range   HIV Screen 4th Generation wRfx Non Reactive Non Reactive  RPR  Result Value Ref Range   RPR Ser Ql Non Reactive Non Reactive  HSV(herpes simplex vrs) 1+2 ab-IgG  Result Value Ref Range   HSV 1 Glycoprotein G Ab, IgG <0.91 0.00 - 0.90 index   HSV 2 IgG, Type Spec <0.91 0.00 - 0.90 index      Assessment & Plan:   Problem List Items Addressed This Visit    None    Visit Diagnoses    Screening for cervical cancer    -  Primary   Relevant Orders   Cytology -  PAP   Routine screening for STI (sexually transmitted infection)       Relevant Orders   HIV Antibody (routine testing w rflx) (Completed)   RPR (Completed)   HSV(herpes simplex vrs) 1+2 ab-IgG (Completed)   GC/Chlamydia Probe Amp    Screenings performed without issue. Follow up based on results  Follow up plan: Return for CPE.

## 2018-08-29 LAB — HSV(HERPES SIMPLEX VRS) I + II AB-IGG: HSV 1 Glycoprotein G Ab, IgG: <0.91 index (ref 0.00–0.90)

## 2018-08-29 LAB — HIV ANTIBODY (ROUTINE TESTING W REFLEX): HIV Screen 4th Generation wRfx: NONREACTIVE

## 2018-08-29 LAB — GC/CHLAMYDIA PROBE AMP
CHLAMYDIA, DNA PROBE: NEGATIVE
Neisseria gonorrhoeae by PCR: NEGATIVE

## 2018-08-29 LAB — RPR: RPR Ser Ql: NONREACTIVE

## 2018-09-01 LAB — CYTOLOGY - PAP
Bacterial vaginitis: NEGATIVE
Candida vaginitis: NEGATIVE
Diagnosis: NEGATIVE

## 2018-09-02 ENCOUNTER — Encounter: Payer: Self-pay | Admitting: Family Medicine

## 2018-09-30 ENCOUNTER — Encounter: Payer: Self-pay | Admitting: Family Medicine

## 2018-09-30 ENCOUNTER — Ambulatory Visit: Payer: BLUE CROSS/BLUE SHIELD | Admitting: Family Medicine

## 2018-09-30 VITALS — BP 101/66 | HR 86 | Temp 97.9°F | Ht 60.0 in | Wt 97.0 lb

## 2018-09-30 DIAGNOSIS — R197 Diarrhea, unspecified: Secondary | ICD-10-CM | POA: Diagnosis not present

## 2018-09-30 LAB — UA/M W/RFLX CULTURE, ROUTINE
BILIRUBIN UA: NEGATIVE
Glucose, UA: NEGATIVE
KETONES UA: NEGATIVE
LEUKOCYTES UA: NEGATIVE
NITRITE UA: NEGATIVE
Protein, UA: NEGATIVE
SPEC GRAV UA: 1.025 (ref 1.005–1.030)
Urobilinogen, Ur: 0.2 mg/dL (ref 0.2–1.0)
pH, UA: 5.5 (ref 5.0–7.5)

## 2018-09-30 LAB — MICROSCOPIC EXAMINATION
Bacteria, UA: NONE SEEN
RBC, UA: NONE SEEN /hpf (ref 0–2)

## 2018-09-30 LAB — CBC WITH DIFFERENTIAL/PLATELET
HEMATOCRIT: 41.2 % (ref 34.0–46.6)
Hemoglobin: 13.7 g/dL (ref 11.1–15.9)
LYMPHS: 31 %
Lymphocytes Absolute: 1.8 10*3/uL (ref 0.7–3.1)
MCH: 29.3 pg (ref 26.6–33.0)
MCHC: 33.3 g/dL (ref 31.5–35.7)
MCV: 88 fL (ref 79–97)
MID (ABSOLUTE): 0.5 10*3/uL (ref 0.1–1.6)
MID: 9 %
Neutrophils Absolute: 3.3 10*3/uL (ref 1.4–7.0)
Neutrophils: 59 %
PLATELETS: 364 10*3/uL (ref 150–450)
RBC: 4.68 x10E6/uL (ref 3.77–5.28)
RDW: 12.6 % (ref 11.7–15.4)
WBC: 5.6 10*3/uL (ref 3.4–10.8)

## 2018-09-30 MED ORDER — HYDROCORTISONE VALERATE 0.2 % EX CREA: 1.0000 "application " | TOPICAL_CREAM | Freq: Two times a day (BID) | CUTANEOUS | 0 refills | Status: AC

## 2018-09-30 MED ORDER — CLOBETASOL PROPIONATE 0.05 % EX OINT: TOPICAL_OINTMENT | CUTANEOUS | 0 refills | Status: DC

## 2018-09-30 NOTE — Progress Notes (Signed)
BP 101/66   Pulse 86   Temp 97.9 F (36.6 C) (Oral)   Ht 5' (1.524 m)   Wt 97 lb (44 kg)   SpO2 100%   BMI 18.94 kg/m    Subjective:    Patient ID: Denise Tate, female    DOB: [birth year], 23 y.o.   MRN: [id number]  HPI: Denise Tate is a 23 y.o. female  Chief Complaint  Patient presents with  . Abdominal Pain    Started last monday. Lots of nausea no emesis. cramping  . Diarrhea    Started last tuesday - Thursday and Saturday and    About 1-3 episodes of diarrhea some days, other days it's just once daily. Some crampy lower abdominal pain as well. Not trying anything for sxs other than one dose of pepto bismol which did not help. Pain seems to start up after she eats and lasts about 10 minutes or so. One person she was visiting out of town had diarrhea as well. Denies fevers, chills, N/V, blood or mucus in stools. No new foods, recent travel outside of country.   Relevant past medical, surgical, family and social history reviewed and updated as indicated. Interim medical history since our last visit reviewed. Allergies and medications reviewed and updated.  Review of Systems  Per HPI unless specifically indicated above     Objective:    BP 101/66   Pulse 86   Temp 97.9 F (36.6 C) (Oral)   Ht 5' (1.524 m)   Wt 97 lb (44 kg)   SpO2 100%   BMI 18.94 kg/m   Wt Readings from Last 3 Encounters:  09/30/18 97 lb (44 kg)  08/28/18 97 lb 4.8 oz (44.1 kg)  02/23/18 106 lb (48.1 kg)    Physical Exam Vitals signs and nursing note reviewed.  Constitutional:      Appearance: Normal appearance. She is not ill-appearing.  HENT:     Head: Atraumatic.  Eyes:     Extraocular Movements: Extraocular movements intact.     Conjunctiva/sclera: Conjunctivae normal.  Neck:     Musculoskeletal: Normal range of motion and neck supple.  Cardiovascular:     Rate and Rhythm: Normal rate and regular rhythm.     Heart sounds: Normal heart sounds.  Pulmonary:     Effort: Pulmonary  effort is normal.     Breath sounds: Normal breath sounds.  Abdominal:     General: Bowel sounds are normal. There is no distension.     Palpations: Abdomen is soft. There is no mass.     Tenderness: There is abdominal tenderness (minimal ttp b/l lower abdomen). There is no right CVA tenderness, left CVA tenderness, guarding or rebound.  Musculoskeletal: Normal range of motion.  Skin:    General: Skin is warm and dry.  Neurological:     Mental Status: She is alert and oriented to person, place, and time.  Psychiatric:        Mood and Affect: Mood normal.        Thought Content: Thought content normal.        Judgment: Judgment normal.     Results for orders placed or performed in visit on 09/30/18  Microscopic Examination  Result Value Ref Range   WBC, UA 0-5 0 - 5 /hpf   RBC, UA None seen 0 - 2 /hpf   Epithelial Cells (non renal) 0-10 0 - 10 /hpf   Bacteria, UA None seen None seen/Few  CBC With Differential/Platelet  Result  Value Ref Range   WBC 5.6 3.4 - 10.8 x10E3/uL   RBC 4.68 3.77 - 5.28 x10E6/uL   Hemoglobin 13.7 11.1 - 15.9 g/dL   Hematocrit 22.3 36.1 - 46.6 %   MCV 88 79 - 97 fL   MCH 29.3 26.6 - 33.0 pg   MCHC 33.3 31.5 - 35.7 g/dL   RDW 22.4 49.7 - 53.0 %   Platelets 364 150 - 450 x10E3/uL   Neutrophils 59 Not Estab. %   Lymphs 31 Not Estab. %   MID 9 Not Estab. %   Neutrophils Absolute 3.3 1.4 - 7.0 x10E3/uL   Lymphocytes Absolute 1.8 0.7 - 3.1 x10E3/uL   MID (Absolute) 0.5 0.1 - 1.6 X10E3/uL  UA/M w/rflx Culture, Routine  Result Value Ref Range   Specific Gravity, UA 1.025 1.005 - 1.030   pH, UA 5.5 5.0 - 7.5   Color, UA Yellow Yellow   Appearance Ur Clear Clear   Leukocytes, UA Negative Negative   Protein, UA Negative Negative/Trace   Glucose, UA Negative Negative   Ketones, UA Negative Negative   RBC, UA Trace (A) Negative   Bilirubin, UA Negative Negative   Urobilinogen, Ur 0.2 0.2 - 1.0 mg/dL   Nitrite, UA Negative Negative   Microscopic  Examination See below:       Assessment & Plan:   Problem List Items Addressed This Visit    None    Visit Diagnoses    Diarrhea, unspecified type    -  Primary   CBC and U/A WNL, declines STI testing, stool testing, or wet prep. Trial probiotics, BRAT diet, push fluids. F/u if worsening or not improving   Relevant Orders   CBC With Differential/Platelet (Completed)   UA/M w/rflx Culture, Routine (Completed)       Follow up plan: Return if symptoms worsen or fail to improve.

## 2018-10-07 ENCOUNTER — Encounter: Payer: Self-pay | Admitting: Family Medicine

## 2018-10-09 ENCOUNTER — Other Ambulatory Visit: Payer: BLUE CROSS/BLUE SHIELD

## 2018-10-09 ENCOUNTER — Other Ambulatory Visit: Payer: Self-pay | Admitting: Family Medicine

## 2018-10-09 DIAGNOSIS — R197 Diarrhea, unspecified: Secondary | ICD-10-CM

## 2018-10-11 LAB — CLOSTRIDIUM DIFFICILE EIA: C difficile Toxins A+B, EIA: NEGATIVE

## 2018-10-13 ENCOUNTER — Encounter: Payer: Self-pay | Admitting: Family Medicine

## 2018-10-13 ENCOUNTER — Other Ambulatory Visit: Payer: Self-pay

## 2018-10-13 ENCOUNTER — Ambulatory Visit: Payer: BLUE CROSS/BLUE SHIELD | Admitting: Family Medicine

## 2018-10-13 VITALS — BP 93/60 | HR 78 | Temp 98.2°F | Ht 60.0 in | Wt 96.7 lb

## 2018-10-13 DIAGNOSIS — G8929 Other chronic pain: Secondary | ICD-10-CM

## 2018-10-13 DIAGNOSIS — M25562 Pain in left knee: Secondary | ICD-10-CM

## 2018-10-13 DIAGNOSIS — M25561 Pain in right knee: Secondary | ICD-10-CM | POA: Diagnosis not present

## 2018-10-13 DIAGNOSIS — N6320 Unspecified lump in the left breast, unspecified quadrant: Secondary | ICD-10-CM | POA: Diagnosis not present

## 2018-10-13 NOTE — Progress Notes (Signed)
BP 93/60 (BP Location: Left Arm, Patient Position: Sitting, Cuff Size: Normal)   Pulse 78   Temp 98.2 F (36.8 C) (Oral)   Ht 5' (1.524 m)   Wt 96 lb 11.2 oz (43.9 kg)   SpO2 100%   BMI 18.89 kg/m    Subjective:    Patient ID: Denise Tate, female    DOB: 1996/07/20, 23 y.o.   MRN: [id number]  HPI: Denise Tate is a 23 y.o. female  Chief Complaint  Patient presents with  . Knee Pain    Bilateral Knee Pain. Ongoing 1 week.   . Breast Mass    Left breast. Noticed a couple months ago.    Left breast lump medial breast, non tender and spongy textured. Noticed it a couple months ago. No erythema, edema, heat, changes in size throughout month. No known fhx of breast cancer.   B/l knee pain for about a week. Used to play tennis and had issues at that time as well. Has tried ice without relief. Stands for work most of the day and this seems to make it worse. Pain increases with bending and standing and is relieved by rest. Not taking anything OTC for relief. Notes she does not stretch or warm up with exercise.   Relevant past medical, surgical, family and social history reviewed and updated as indicated. Interim medical history since our last visit reviewed. Allergies and medications reviewed and updated.  Review of Systems  Per HPI unless specifically indicated above     Objective:    BP 93/60 (BP Location: Left Arm, Patient Position: Sitting, Cuff Size: Normal)   Pulse 78   Temp 98.2 F (36.8 C) (Oral)   Ht 5' (1.524 m)   Wt 96 lb 11.2 oz (43.9 kg)   SpO2 100%   BMI 18.89 kg/m   Wt Readings from Last 3 Encounters:  10/13/18 96 lb 11.2 oz (43.9 kg)  09/30/18 97 lb (44 kg)  08/28/18 97 lb 4.8 oz (44.1 kg)    Physical Exam Vitals signs and nursing note reviewed.  Constitutional:      Appearance: Normal appearance. She is not ill-appearing.  HENT:     Head: Atraumatic.  Eyes:     Extraocular Movements: Extraocular movements intact.     Conjunctiva/sclera:  Conjunctivae normal.  Neck:     Musculoskeletal: Normal range of motion and neck supple.  Cardiovascular:     Rate and Rhythm: Normal rate and regular rhythm.     Heart sounds: Normal heart sounds.  Pulmonary:     Effort: Pulmonary effort is normal.     Breath sounds: Normal breath sounds.  Chest:    Musculoskeletal: Normal range of motion.        General: Tenderness (ttp b/l knees at insertion of quadricep muscles into joint) present. No swelling or deformity.     Comments: - mcmurrays sign - varus and valgus testing  Skin:    General: Skin is warm and dry.  Neurological:     Mental Status: She is alert and oriented to person, place, and time.  Psychiatric:        Mood and Affect: Mood normal.        Thought Content: Thought content normal.        Judgment: Judgment normal.     Results for orders placed or performed in visit on 10/09/18  Stool C-Diff Toxin Assay  Result Value Ref Range   C difficile Toxins A+B, EIA Negative Negative  Ova and  parasite examination  Result Value Ref Range   OVA + PARASITE EXAM Final report    O&P result 1 Comment   Fecal leukocytes  Result Value Ref Range   White Blood Cells (WBC), Stool Final report None Seen   Result 1 Comment       Assessment & Plan:   Problem List Items Addressed This Visit    None    Visit Diagnoses    Left breast mass    -  Primary   Pt requesting imaging for reassurance. Will obtain imaging and conitnue to monitor for any changes   Relevant Orders   US BREAST LTD UNI LEFT INC AXILLA   MM Digital Diagnostic Bilat   Chronic pain of both knees       Suspect patellofemoral syndrome, reviewed stretches, rolling with foam roller, OTC NSAIDs prn.        Follow up plan: Return if symptoms worsen or fail to improve.

## 2018-10-13 NOTE — Patient Instructions (Signed)
Patellofemoral Pain Syndrome    Patellofemoral pain syndrome is a condition in which the tissue (cartilage) on the underside of the kneecap (patella) softens or breaks down. This causes pain in the front of the knee. The condition is also called runner's knee or chondromalacia patella. Patellofemoral pain syndrome is most common in young adults who are active in sports.  The knee is the largest joint in the body. The patella covers the front of the knee and is attached to muscles above and below the knee. The underside of the patella is covered with a smooth type of cartilage (synovium). The smooth surface helps the patella to glide easily when you move your knee. Patellofemoral pain syndrome causes swelling in the joint linings and bone surfaces in the knee.  What are the causes?  This condition may be caused by:   Overuse of the knee.   Poor alignment of your knee joints.   Weak leg muscles.   A direct blow to your kneecap.  What increases the risk?  You are more likely to develop this condition if:   You do a lot of activities that can wear down your kneecap. These include:  ? Running.  ? Squatting.  ? Climbing stairs.   You start a new physical activity or exercise program.   You wear shoes that do not fit well.   You do not have good leg strength.   You are overweight.  What are the signs or symptoms?  The main symptom of this condition is knee pain. This may feel like a dull, aching pain underneath your patella, in the front of your knee. There may be a popping or cracking sound when you move your knee. Pain may get worse with:   Exercise.   Climbing stairs.   Running.   Jumping.   Squatting.   Kneeling.   Sitting for a long time.   Moving or pushing on your patella.  How is this diagnosed?  This condition may be diagnosed based on:   Your symptoms and medical history. You may be asked about your recent physical activities and which ones cause knee pain.   A physical exam. This may include:   ? Moving your patella back and forth.  ? Checking your range of knee motion.  ? Having you squat or jump to see if you have pain.  ? Checking the strength of your leg muscles.   Imaging tests to confirm the diagnosis. These may include an MRI of your knee.  How is this treated?  This condition may be treated at home with rest, ice, compression, and elevation (RICE).   Other treatments may include:   Nonsteroidal anti-inflammatory drugs (NSAIDs).   Physical therapy to stretch and strengthen your leg muscles.   Shoe inserts (orthotics) to take stress off your knee.   A knee brace or knee support.   Adhesive tapes to the skin.   Surgery to remove damaged cartilage or move the patella to a better position. This is rare.  Follow these instructions at home:  If you have a shoe or brace:   Wear the shoe or brace as told by your health care provider. Remove it only as told by your health care provider.   Loosen the shoe or brace if your toes tingle, become numb, or turn cold and blue.   Keep the shoe or brace clean.   If the shoe or brace is not waterproof:  ? Do not let it get wet.  ?   Cover it with a watertight covering when you take a bath or a shower.  Managing pain, stiffness, and swelling   If directed, put ice on the painful area.  ? If you have a removable shoe or brace, remove it as told by your health care provider.  ? Put ice in a plastic bag.  ? Place a towel between your skin and the bag.  ? Leave the ice on for 20 minutes, 2-3 times a day.   Move your toes often to avoid stiffness and to lessen swelling.   Rest your knee:  ? Avoid activities that cause knee pain.  ? When sitting or lying down, raise (elevate) the injured area above the level of your heart, whenever possible.  General instructions   Take over-the-counter and prescription medicines only as told by your health care provider.   Use splints, braces, knee supports, or walking aids as directed by your health care provider.   Perform  stretching and strengthening exercises as told by your health care provider or physical therapist.   Do not use any products that contain nicotine or tobacco, such as cigarettes and e-cigarettes. These can delay healing. If you need help quitting, ask your health care provider.   Return to your normal activities as told by your health care provider. Ask your health care provider what activities are safe for you.   Keep all follow-up visits as told by your health care provider. This is important.  Contact a health care provider if:   Your symptoms get worse.   You are not improving with home care.  Summary   Patellofemoral pain syndrome is a condition in which the tissue (cartilage) on the underside of the kneecap (patella) softens or breaks down.   This condition causes swelling in the joint linings and bone surfaces in the knee. This leads to pain in the front of the knee.   This condition may be treated at home with rest, ice, compression, and elevation (RICE).   Use splints, braces, knee supports, or walking aids as directed by your health care provider.  This information is not intended to replace advice given to you by your health care provider. Make sure you discuss any questions you have with your health care provider.  Document Released: 07/31/2009 Document Revised: 09/22/2017 Document Reviewed: 09/22/2017  [employer]vier Interactive Patient Education  2019 [employer]

## 2018-10-14 ENCOUNTER — Encounter: Payer: Self-pay | Admitting: Family Medicine

## 2018-10-14 LAB — OVA AND PARASITE EXAMINATION

## 2018-10-15 LAB — FECAL LEUKOCYTES

## 2018-10-21 ENCOUNTER — Other Ambulatory Visit: Payer: Self-pay | Admitting: Family Medicine

## 2018-10-21 MED ORDER — FLUCONAZOLE 150 MG PO TABS: 150.0000 mg | ORAL_TABLET | Freq: Once | ORAL | 0 refills | Status: AC

## 2018-10-22 ENCOUNTER — Ambulatory Visit
Admission: RE | Admit: 2018-10-22 | Discharge: 2018-10-22 | Disposition: A | Discharge status: Home / Self Care | Payer: BLUE CROSS/BLUE SHIELD | Source: Ambulatory Visit | Attending: Family Medicine | Admitting: Family Medicine

## 2018-10-22 DIAGNOSIS — N6320 Unspecified lump in the left breast, unspecified quadrant: Secondary | ICD-10-CM | POA: Insufficient documentation

## 2018-10-23 ENCOUNTER — Other Ambulatory Visit: Payer: Self-pay | Admitting: Family Medicine

## 2018-10-23 DIAGNOSIS — N6320 Unspecified lump in the left breast, unspecified quadrant: Secondary | ICD-10-CM

## 2018-11-13 ENCOUNTER — Encounter: Payer: Self-pay | Admitting: Family Medicine

## 2018-11-19 ENCOUNTER — Other Ambulatory Visit: Payer: BLUE CROSS/BLUE SHIELD

## 2018-11-19 ENCOUNTER — Encounter: Payer: Self-pay | Admitting: Family Medicine

## 2018-11-19 ENCOUNTER — Ambulatory Visit: Payer: BLUE CROSS/BLUE SHIELD | Admitting: Family Medicine

## 2018-11-19 ENCOUNTER — Other Ambulatory Visit: Payer: Self-pay

## 2018-11-19 VITALS — BP 99/65 | HR 87 | Temp 98.3°F | Ht 60.0 in | Wt 97.0 lb

## 2018-11-19 DIAGNOSIS — R1030 Lower abdominal pain, unspecified: Secondary | ICD-10-CM | POA: Diagnosis not present

## 2018-11-19 DIAGNOSIS — N644 Mastodynia: Secondary | ICD-10-CM

## 2018-11-19 DIAGNOSIS — B9689 Other specified bacterial agents as the cause of diseases classified elsewhere: Secondary | ICD-10-CM

## 2018-11-19 DIAGNOSIS — N898 Other specified noninflammatory disorders of vagina: Secondary | ICD-10-CM | POA: Diagnosis not present

## 2018-11-19 DIAGNOSIS — N760 Acute vaginitis: Secondary | ICD-10-CM | POA: Diagnosis not present

## 2018-11-19 LAB — WET PREP FOR TRICH, YEAST, CLUE
Clue Cell Exam: POSITIVE — AB
Trichomonas Exam: NEGATIVE
Yeast Exam: NEGATIVE

## 2018-11-19 MED ORDER — METRONIDAZOLE 500 MG PO TABS: 500.0000 mg | ORAL_TABLET | Freq: Three times a day (TID) | ORAL | 0 refills | Status: DC

## 2018-11-19 NOTE — Progress Notes (Signed)
BP 99/65   Pulse 87   Temp 98.3 F (36.8 C) (Oral)   Ht 5' (1.524 m)   Wt 97 lb (44 kg)   SpO2 98%   BMI 18.94 kg/m    Subjective:    Patient ID: Denise Tate, female    DOB: [birth year], 23 y.o.   MRN: [id number]  HPI: Denise Tate is a 23 y.o. female  Chief Complaint  Patient presents with  . Vaginal problems    burning when urinate, and when whipe her self x 1 week   . Pain    left nipple since Saturday  . Abdominal Pain    pt states that she is not feeling well yet after her last OV    Here today with some vaginal irritation, dysuria, lower abdominal cramping intermittently, and vaginal discharge the past week. Notes sxs started after having unprotected sex with a new partner a little over a week ago. Denies fevers, chills, N/V/D, back pain, hematuria, hx of STIs. Just finishing her period now, light spotting today.   Also having some left nipple tenderness that seems to have started right before her period came on last week. No skin changes, nipple discharge.   Relevant past medical, surgical, family and social history reviewed and updated as indicated. Interim medical history since our last visit reviewed. Allergies and medications reviewed and updated.  Review of Systems  Per HPI unless specifically indicated above     Objective:    BP 99/65   Pulse 87   Temp 98.3 F (36.8 C) (Oral)   Ht 5' (1.524 m)   Wt 97 lb (44 kg)   SpO2 98%   BMI 18.94 kg/m   Wt Readings from Last 3 Encounters:  11/19/18 97 lb (44 kg)  10/13/18 96 lb 11.2 oz (43.9 kg)  09/30/18 97 lb (44 kg)    Physical Exam Vitals signs and nursing note reviewed.  Constitutional:      Appearance: Normal appearance. She is not ill-appearing.  HENT:     Head: Atraumatic.  Eyes:     Extraocular Movements: Extraocular movements intact.     Conjunctiva/sclera: Conjunctivae normal.  Neck:     Musculoskeletal: Normal range of motion and neck supple.  Cardiovascular:     Rate and Rhythm: Normal  rate and regular rhythm.     Heart sounds: Normal heart sounds.  Pulmonary:     Effort: Pulmonary effort is normal.     Breath sounds: Normal breath sounds.  Abdominal:     General: Bowel sounds are normal.     Palpations: Abdomen is soft. There is no mass.     Tenderness: There is abdominal tenderness (minimal lower abdominal ttp). There is no right CVA tenderness, left CVA tenderness, guarding or rebound.  Genitourinary:    Vagina: Vaginal discharge present.  Musculoskeletal: Normal range of motion.  Skin:    General: Skin is warm and dry.  Neurological:     Mental Status: She is alert and oriented to person, place, and time.  Psychiatric:        Mood and Affect: Mood normal.        Thought Content: Thought content normal.        Judgment: Judgment normal.     Results for orders placed or performed in visit on 11/19/18  WET PREP FOR TRICH, YEAST, CLUE  Result Value Ref Range   Trichomonas Exam Negative Negative   Yeast Exam Negative Negative   Clue Cell Exam Positive (A)  Negative  GC/Chlamydia Probe Amp  Result Value Ref Range   Chlamydia trachomatis, NAA Negative Negative   Neisseria Gonorrhoeae by PCR Negative Negative  Microscopic Examination  Result Value Ref Range   WBC, UA 0-5 0 - 5 /hpf   RBC None seen 0 - 2 /hpf   Epithelial Cells (non renal) 0-10 0 - 10 /hpf   Mucus, UA Present Not Estab.   Bacteria, UA Moderate (A) None seen/Few  Urine Culture, Reflex  Result Value Ref Range   Urine Culture, Routine Final report    Organism ID, Bacteria No growth   UA/M w/rflx Culture, Routine  Result Value Ref Range   Specific Gravity, UA 1.015 1.005 - 1.030   pH, UA 5.0 5.0 - 7.5   Color, UA Yellow Yellow   Appearance Ur Clear Clear   Leukocytes,UA Trace (A) Negative   Protein,UA Negative Negative/Trace   Glucose, UA Negative Negative   Ketones, UA Negative Negative   RBC, UA Trace (A) Negative   Bilirubin, UA Negative Negative   Urobilinogen, Ur 0.2 0.2 - 1.0  mg/dL   Nitrite, UA Negative Negative   Microscopic Examination See below:    Urinalysis Reflex Comment   HIV Antibody (routine testing w rflx)  Result Value Ref Range   HIV Screen 4th Generation wRfx Non Reactive Non Reactive  RPR  Result Value Ref Range   RPR Ser Ql Non Reactive Non Reactive  HSV(herpes simplex vrs) 1+2 ab-IgG  Result Value Ref Range   HSV 1 Glycoprotein G Ab, IgG <0.91 0.00 - 0.90 index   HSV 2 IgG, Type Spec <0.91 0.00 - 0.90 index      Assessment & Plan:   Problem List Items Addressed This Visit    None    Visit Diagnoses    BV (bacterial vaginosis)    -  Primary   Tx with flagyl, probiotics, continued good vaginal hygiene. F/u if not improving   Relevant Medications   metroNIDAZOLE (FLAGYL) 500 MG tablet   Vaginal irritation       Will r/o STIs given new partner   Relevant Orders   WET PREP FOR TRICH, YEAST, CLUE (Completed)   HIV Antibody (routine testing w rflx) (Completed)   RPR (Completed)   HSV(herpes simplex vrs) 1+2 ab-IgG (Completed)   GC/Chlamydia Probe Amp (Completed)   Lower abdominal pain       Some bacteria present on U/A, will await cx and tx for BV. Repeat U/A if no improvement upon completion of flagyl or if cx positive for abnormal bacteria   Relevant Orders   UA/M w/rflx Culture, Routine (Completed)   Breast tenderness       Suspect hormonal tissue changes. Reassurance given, pt declines imaging or further testing at this time. Conitnue to monitor       Follow up plan: Return if symptoms worsen or fail to improve.

## 2018-11-20 LAB — HSV(HERPES SIMPLEX VRS) I + II AB-IGG: HSV 1 Glycoprotein G Ab, IgG: <0.91 index (ref 0.00–0.90)

## 2018-11-20 LAB — HIV ANTIBODY (ROUTINE TESTING W REFLEX): HIV SCREEN 4TH GENERATION: NONREACTIVE

## 2018-11-20 LAB — GC/CHLAMYDIA PROBE AMP
CHLAMYDIA, DNA PROBE: NEGATIVE
NEISSERIA GONORRHOEAE, BY PCR: NEGATIVE

## 2018-11-20 LAB — RPR: RPR: NONREACTIVE

## 2018-11-21 LAB — UA/M W/RFLX CULTURE, ROUTINE
BILIRUBIN UA: NEGATIVE
Glucose, UA: NEGATIVE
Ketones, UA: NEGATIVE
Nitrite, UA: NEGATIVE
PH UA: 5 (ref 5.0–7.5)
PROTEIN UA: NEGATIVE
SPEC GRAV UA: 1.015 (ref 1.005–1.030)
UUROB: 0.2 mg/dL (ref 0.2–1.0)

## 2018-11-21 LAB — MICROSCOPIC EXAMINATION: RBC: NONE SEEN /hpf (ref 0–2)

## 2018-11-21 LAB — URINE CULTURE, REFLEX: ORGANISM ID, BACTERIA: NO GROWTH

## 2018-11-29 ENCOUNTER — Encounter: Payer: Self-pay | Admitting: Family Medicine

## 2018-12-03 ENCOUNTER — Encounter: Payer: Self-pay | Admitting: Family Medicine

## 2018-12-04 ENCOUNTER — Other Ambulatory Visit: Payer: Self-pay | Admitting: Family Medicine

## 2018-12-04 MED ORDER — NYSTATIN 100000 UNIT/ML MT SUSP: 5.0000 mL | Freq: Four times a day (QID) | OROMUCOSAL | 0 refills | Status: DC

## 2018-12-07 ENCOUNTER — Other Ambulatory Visit: Payer: Self-pay | Admitting: Family Medicine

## 2018-12-07 MED ORDER — FLUCONAZOLE 150 MG PO TABS: 150.0000 mg | ORAL_TABLET | Freq: Once | ORAL | 0 refills | Status: AC

## 2019-03-18 ENCOUNTER — Other Ambulatory Visit: Payer: Self-pay

## 2019-03-18 ENCOUNTER — Ambulatory Visit (INDEPENDENT_AMBULATORY_CARE_PROVIDER_SITE_OTHER): Payer: BLUE CROSS/BLUE SHIELD | Admitting: Family Medicine

## 2019-03-18 ENCOUNTER — Encounter: Payer: Self-pay | Admitting: Family Medicine

## 2019-03-18 VITALS — Ht 60.0 in | Wt 95.8 lb

## 2019-03-18 DIAGNOSIS — N760 Acute vaginitis: Secondary | ICD-10-CM | POA: Diagnosis not present

## 2019-03-18 DIAGNOSIS — B9689 Other specified bacterial agents as the cause of diseases classified elsewhere: Secondary | ICD-10-CM | POA: Diagnosis not present

## 2019-03-18 NOTE — Progress Notes (Signed)
Ht 5' (1.524 m)   Wt 95 lb 12.8 oz (43.5 kg)   BMI 18.71 kg/m    Subjective:    Patient ID: Denise Tate, female    DOB: 11/27/1995, 23 y.o.   MRN: [id number]  HPI: Denise Tate is a 23 y.o. female  Chief Complaint  Patient presents with  . Vaginal Itching    Patient states outter part of vagina will itch. [facility] was positive for BV but patient has not started the antibiotic yet.    . This visit was completed via WebEx due to the restrictions of the COVID-19 pandemic. All issues as above were discussed and addressed. Physical exam was done as above through visual confirmation on WebEx. If it was felt that the patient should be evaluated in the office, they were directed there. The patient verbally consented to this visit. . Location of the patient: home . Location of the provider: work . Those involved with this call:  . Provider: Merrie Roof, PA-C . CMA: Merilyn Baba, [place] . Front Desk/Registration: Jill Side  . Time spent on call: 15 minutes with patient face to face via video conference. More than 50% of this time was spent in counseling and coordination of care. 5 minutes total spent in review of patient's record and preparation of their chart. I verified patient identity using two factors (patient name and date of birth). Patient consents verbally to being seen via telemedicine visit today.   Patient presenting today for advice regarding recurrent BV infections. Has tried nutriblast boric acid supplements twice now and has been treated with flagyl several times over the past 4 months. Still having issues with BV infections. Was seen at [facility] yesterday and tested positive for BV, negative for other infections so far. Was given clindamycin but wanting to get a second opinion from another GYN for a more natural approach to the issue. Denies fevers, chills, exposure to STIs, irregular vaginal bleeding.   Relevant past medical, surgical, family and social history reviewed and  updated as indicated. Interim medical history since our last visit reviewed. Allergies and medications reviewed and updated.  Review of Systems  Per HPI unless specifically indicated above     Objective:    Ht 5' (1.524 m)   Wt 95 lb 12.8 oz (43.5 kg)   BMI 18.71 kg/m   Wt Readings from Last 3 Encounters:  03/18/19 95 lb 12.8 oz (43.5 kg)  11/19/18 97 lb (44 kg)  10/13/18 96 lb 11.2 oz (43.9 kg)    Physical Exam Vitals signs and nursing note reviewed.  Constitutional:      General: She is not in acute distress.    Appearance: Normal appearance.  HENT:     Head: Atraumatic.     Right Ear: External ear normal.     Left Ear: External ear normal.     Nose: Nose normal. No congestion.     Mouth/Throat:     Mouth: Mucous membranes are moist.     Pharynx: Oropharynx is clear. No posterior oropharyngeal erythema.  Eyes:     Extraocular Movements: Extraocular movements intact.     Conjunctiva/sclera: Conjunctivae normal.  Neck:     Musculoskeletal: Normal range of motion.  Cardiovascular:     Comments: Unable to assess via virtual visit Pulmonary:     Effort: Pulmonary effort is normal. No respiratory distress.  Musculoskeletal: Normal range of motion.  Skin:    General: Skin is dry.     Findings: No erythema.  Neurological:     Mental Status: She is alert and oriented to person, place, and time.  Psychiatric:        Mood and Affect: Mood normal.        Thought Content: Thought content normal.        Judgment: Judgment normal.     Results for orders placed or performed in visit on 11/19/18  WET PREP FOR TRICH, YEAST, CLUE   Specimen: Urine   URINE  Result Value Ref Range   Trichomonas Exam Negative Negative   Yeast Exam Negative Negative   Clue Cell Exam Positive (A) Negative  GC/Chlamydia Probe Amp   Specimen: Vaginal Fluid   UR  Result Value Ref Range   Chlamydia trachomatis, NAA Negative Negative   Neisseria Gonorrhoeae by PCR Negative Negative   Microscopic Examination   URINE  Result Value Ref Range   WBC, UA 0-5 0 - 5 /hpf   RBC None seen 0 - 2 /hpf   Epithelial Cells (non renal) 0-10 0 - 10 /hpf   Mucus, UA Present Not Estab.   Bacteria, UA Moderate (A) None seen/Few  Urine Culture, Reflex   URINE  Result Value Ref Range   Urine Culture, Routine Final report    Organism ID, Bacteria No growth   UA/M w/rflx Culture, Routine   Specimen: Urine   URINE  Result Value Ref Range   Specific Gravity, UA 1.015 1.005 - 1.030   pH, UA 5.0 5.0 - 7.5   Color, UA Yellow Yellow   Appearance Ur Clear Clear   Leukocytes,UA Trace (A) Negative   Protein,UA Negative Negative/Trace   Glucose, UA Negative Negative   Ketones, UA Negative Negative   RBC, UA Trace (A) Negative   Bilirubin, UA Negative Negative   Urobilinogen, Ur 0.2 0.2 - 1.0 mg/dL   Nitrite, UA Negative Negative   Microscopic Examination See below:    Urinalysis Reflex Comment   HIV Antibody (routine testing w rflx)  Result Value Ref Range   HIV Screen 4th Generation wRfx Non Reactive Non Reactive  RPR  Result Value Ref Range   RPR Ser Ql Non Reactive Non Reactive  HSV(herpes simplex vrs) 1+2 ab-IgG  Result Value Ref Range   HSV 1 Glycoprotein G Ab, IgG <0.91 0.00 - 0.90 index   HSV 2 IgG, Type Spec <0.91 0.00 - 0.90 index      Assessment & Plan:   Problem List Items Addressed This Visit    None    Visit Diagnoses    BV (bacterial vaginosis)    -  Primary   + yesterday at GYN. Recommended taking the clinda, offered referral for second opinion and reviewed natural remedies. F/u as needed.    Relevant Medications   clindamycin (CLEOCIN) 300 MG capsule       Follow up plan: Return if symptoms worsen or fail to improve.

## 2019-03-29 ENCOUNTER — Encounter: Payer: Self-pay | Admitting: Family Medicine

## 2019-03-29 ENCOUNTER — Other Ambulatory Visit: Payer: Self-pay | Admitting: Family Medicine

## 2019-03-29 DIAGNOSIS — N760 Acute vaginitis: Secondary | ICD-10-CM

## 2019-03-29 DIAGNOSIS — B9689 Other specified bacterial agents as the cause of diseases classified elsewhere: Secondary | ICD-10-CM

## 2019-03-31 ENCOUNTER — Ambulatory Visit (INDEPENDENT_AMBULATORY_CARE_PROVIDER_SITE_OTHER): Payer: BLUE CROSS/BLUE SHIELD | Admitting: Maternal Newborn

## 2019-03-31 ENCOUNTER — Other Ambulatory Visit: Payer: Self-pay

## 2019-03-31 ENCOUNTER — Encounter: Payer: Self-pay | Admitting: Maternal Newborn

## 2019-03-31 VITALS — BP 88/60 | Ht 60.0 in | Wt 95.6 lb

## 2019-03-31 DIAGNOSIS — B373 Candidiasis of vulva and vagina: Secondary | ICD-10-CM | POA: Diagnosis not present

## 2019-03-31 DIAGNOSIS — N898 Other specified noninflammatory disorders of vagina: Secondary | ICD-10-CM

## 2019-03-31 DIAGNOSIS — R198 Other specified symptoms and signs involving the digestive system and abdomen: Secondary | ICD-10-CM | POA: Diagnosis not present

## 2019-03-31 DIAGNOSIS — B3731 Acute candidiasis of vulva and vagina: Secondary | ICD-10-CM

## 2019-03-31 DIAGNOSIS — N760 Acute vaginitis: Secondary | ICD-10-CM

## 2019-03-31 DIAGNOSIS — B9689 Other specified bacterial agents as the cause of diseases classified elsewhere: Secondary | ICD-10-CM

## 2019-03-31 MED ORDER — FLUCONAZOLE 150 MG PO TABS: 150.0000 mg | ORAL_TABLET | Freq: Once | ORAL | 0 refills | Status: AC

## 2019-03-31 MED ORDER — SOLOSEC 2 G PO PACK: 1.0000 | PACK | Freq: Once | ORAL | 0 refills | Status: AC

## 2019-03-31 NOTE — Progress Notes (Signed)
Obstetrics & Gynecology Office Visit   Chief Complaint:  Chief Complaint  Patient presents with  . Vaginal Irritation    on vulva, irritation as well, recurrent BV's since March 2020    History of Present Illness: Denise Tate is here today for vaginal and vulvar irritation that has been present for a few weeks. She has a history of recurrent BV and was recently treated with clindamycin, but symptoms have recurred. She is aware of measures to prevent BV and has not had any changes in her personal hygiene products or laundry products.  During review of systems she also noted that she has had problems with nausea and alternating constipation and diarrhea.   Review of Systems  Constitutional: Negative.   HENT: Negative.   Eyes: Negative.   Respiratory: Negative.   Cardiovascular: Negative.   Gastrointestinal: Positive for constipation, diarrhea and nausea.  Genitourinary:       Vaginal and vulvar irritation  Musculoskeletal: Negative.   Skin: Positive for itching and rash.  Neurological: Negative.   Endo/Heme/Allergies: Negative.   Psychiatric/Behavioral: Negative.   All other systems reviewed and are negative.   Past Medical History:  Past Medical History:  Diagnosis Date  . Eczema   . Oxygen deficiency     Past Surgical History:  History reviewed. No pertinent surgical history.  Gynecologic History: Patient's last menstrual period was 03/14/2019 (exact date).  Obstetric History: G0P0000  Family History:  Family History  Problem Relation Age of Onset  . Hyperlipidemia Father     Social History:  Social History   Socioeconomic History  . Marital status: Single    Spouse name: Not on file  . Number of children: Not on file  . Years of education: Not on file  . Highest education level: Not on file  Occupational History  . Not on file  Social Needs  . Financial resource strain: Not on file  . Food insecurity    Worry: Not on file    Inability: Not on file   . Transportation needs    Medical: Not on file    Non-medical: Not on file  Tobacco Use  . Smoking status: Never Smoker  . Smokeless tobacco: Never Used  Substance and Sexual Activity  . Alcohol use: No  . Drug use: No  . Sexual activity: Yes    Birth control/protection: None  Lifestyle  . Physical activity    Days per week: Not on file    Minutes per session: Not on file  . Stress: Not on file  Relationships  . Social Musicianconnections    Talks on phone: Not on file    Gets together: Not on file    Attends religious service: Not on file    Active member of club or organization: Not on file    Attends meetings of clubs or organizations: Not on file    Relationship status: Not on file  . Intimate partner violence    Fear of current or ex partner: Not on file    Emotionally abused: Not on file    Physically abused: Not on file    Forced sexual activity: Not on file  Other Topics Concern  . Not on file  Social History Narrative  . Not on file    Allergies:  No Known Allergies  Medications: Prior to Admission medications   Medication Sig Start Date End Date Taking? Authorizing Provider  albuterol (PROVENTIL HFA;VENTOLIN HFA) 108 (90 Base) MCG/ACT inhaler Inhale 2 puffs into the  lungs every 6 (six) hours as needed for wheezing or shortness of breath. 06/23/17  Yes Volney American, PA-C  clobetasol ointment (TEMOVATE) 0.05 % Apply 1-2 times a day to affected areas as needed until smooth, repeat if needed. Avoid on face and skin folds. 09/30/18  Yes Volney American, PA-C  hydrocortisone valerate cream (WESTCORT) 0.2 % Apply 1 application topically 2 (two) times daily. 09/30/18  Yes Volney American, PA-C    Physical Exam Vitals:  Vitals:   03/31/19 1322  BP: (!) 88/60   Patient's last menstrual period was 03/14/2019 (exact date).  General: NAD Pulmonary: No increased work of breathing Genitourinary:  External: External female genitalia appears irritated.   Normal  urethral meatus, normal Bartholin's and Skene's glands.    Vagina: Normal vaginal mucosa, no evidence of prolapse,  small amount of white discharge present.   Neurologic: Grossly intact Psychiatric: mood appropriate, affect full  Wet Prep: PH: <4.5 Clue Cells: Positive Fungal elements: Positive Trichomonas: Negative  Assessment: 23 y.o. G0P0000 with bacterial vaginosis/yeast infection; also reports nausea with alternating constipation and diarrhea  Plan: Problem List Items Addressed This Visit    None    Visit Diagnoses    Vaginal irritation    -  Primary   Vulvovaginal candidiasis       Bacterial vaginosis       Alternating constipation and diarrhea         1) Rx for Solosec for BV as she has recently been treated with clindamycin without resolution of symptoms. Discussed probiotics and reviewed measures that can help with recurrent symptoms.  2) Rx for Diflucan to treat yeast infection.  3) Referral to GI for ongoing symptoms of nausea with alternating constipation and diarrhea.  Avel Sensor, CNM 03/31/2019

## 2019-04-02 ENCOUNTER — Telehealth: Payer: Self-pay

## 2019-04-02 NOTE — Telephone Encounter (Signed)
Gracie and a I have not seen any PA Paper work

## 2019-04-02 NOTE — Telephone Encounter (Signed)
Pt calling triage regarding her RX. States the pharm has not heard anything back about PA, please advise. CB [phone]

## 2019-04-05 ENCOUNTER — Encounter: Payer: Self-pay | Admitting: Maternal Newborn

## 2019-04-05 NOTE — Telephone Encounter (Signed)
UTR pt to ask which rx needs PA.

## 2019-04-06 ENCOUNTER — Other Ambulatory Visit: Payer: Self-pay

## 2019-04-06 ENCOUNTER — Encounter: Payer: Self-pay | Admitting: Gastroenterology

## 2019-04-06 ENCOUNTER — Ambulatory Visit (INDEPENDENT_AMBULATORY_CARE_PROVIDER_SITE_OTHER): Payer: BLUE CROSS/BLUE SHIELD | Admitting: Gastroenterology

## 2019-04-06 VITALS — BP 96/61 | HR 94 | Temp 96.6°F | Resp 17 | Ht 60.0 in | Wt 96.8 lb

## 2019-04-06 DIAGNOSIS — R140 Abdominal distension (gaseous): Secondary | ICD-10-CM

## 2019-04-06 DIAGNOSIS — K5909 Other constipation: Secondary | ICD-10-CM | POA: Diagnosis not present

## 2019-04-06 NOTE — Progress Notes (Signed)
Cephas Darby, MD [address]  [place]  [place], [place] [address]  Main: [phone]  Fax: [phone]    Gastroenterology Consultation  Referring Provider:     Rexene Agent, CNM Primary Care Physician:  Volney American, PA-C Primary Gastroenterologist:  Dr. Cephas Darby Reason for Consultation:     Constipation, abdominal bloating        HPI:   Denise Tate is a 23 y.o. female referred by Dr. Orene Desanctis, Lilia Argue, PA-C  for consultation & management of chronic constipation abdominal bloating.  Patient reports that she has been suffering from constipation as a child.  For the last 6 months, her symptoms including bloating, lower abdominal pain and constipation have gotten worse.  She reports that she does not like eating vegetables and she almost completely eliminate vegetables from her diet.  She likes to eat fruits, mostly eat rice, bread and meat daily.  Her bloating is postprandial and so is abdominal pain.  She does have sensation of incomplete emptying.  Her stools are hard balls and has bowel movement every 3 days.  She has not tried any medications for constipation.  She denies weight loss, nausea or vomiting.  She takes probiotics intermittently as she has been having vaginal yeast infections.  NSAIDs: None  Antiplts/Anticoagulants/Anti thrombotics: None  GI Procedures: None She denies family history of GI malignancy  Past Medical History:  Diagnosis Date  . Eczema   . Oxygen deficiency     No past surgical history on file.  Current Outpatient Medications:  .  albuterol (PROVENTIL HFA;VENTOLIN HFA) 108 (90 Base) MCG/ACT inhaler, Inhale 2 puffs into the lungs every 6 (six) hours as needed for wheezing or shortness of breath., Disp: 1 Inhaler, Rfl: 3 .  clobetasol ointment (TEMOVATE) 0.05 %, Apply 1-2 times a day to affected areas as needed until smooth, repeat if needed. Avoid on face and skin folds., Disp: 30 g, Rfl: 0 .  fluconazole  (DIFLUCAN) 150 MG tablet, TAKE 1 TABLET ONCE FOR 1 DOSE. CAN TAKE ADDITIONAL DOSE THREE DAYS LATER IF SYMPTOMS PERSIST, Disp: , Rfl:  .  hydrocortisone valerate cream (WESTCORT) 0.2 %, Apply 1 application topically 2 (two) times daily., Disp: 45 g, Rfl: 0    Family History  Problem Relation Age of Onset  . Hyperlipidemia Father      Social History   Tobacco Use  . Smoking status: Never Smoker  . Smokeless tobacco: Never Used  Substance Use Topics  . Alcohol use: No  . Drug use: No    Allergies as of 04/06/2019  . (No Known Allergies)    Review of Systems:    All systems reviewed and negative except where noted in HPI.   Physical Exam:  BP 96/61 (BP Location: Left Arm, Patient Position: Sitting, Cuff Size: Normal)   Pulse 94   Temp (!) 96.6 F (35.9 C)   Resp 17   Ht 5' (1.524 m)   Wt 96 lb 12.8 oz (43.9 kg)   LMP 03/14/2019 (Exact Date)   BMI 18.90 kg/m  Patient's last menstrual period was 03/14/2019 (exact date).  General:   Alert,  Well-developed, well-nourished, pleasant and cooperative in NAD Head:  Normocephalic and atraumatic. Eyes:  Sclera clear, no icterus.   Conjunctiva pink. Ears:  Normal auditory acuity. Nose:  No deformity, discharge, or lesions. Mouth:  No deformity or lesions,oropharynx pink & moist. Neck:  Supple; no masses or thyromegaly. Lungs:  Respirations even and unlabored.  Clear throughout to auscultation.   No wheezes, crackles, or rhonchi. No acute distress. Heart:  Regular rate and rhythm; no murmurs, clicks, rubs, or gallops. Abdomen:  Normal bowel sounds. Soft, non-tender and non-distended without masses, hepatosplenomegaly or hernias noted.  No guarding or rebound tenderness.   Rectal: Not performed Msk:  Symmetrical without gross deformities. Good, equal movement & strength bilaterally. Pulses:  Normal pulses noted. Extremities:  No clubbing or edema.  No cyanosis. Neurologic:  Alert and oriented x3;  grossly normal neurologically.  Skin:  Intact without significant lesions or rashes. No jaundice. Psych:  Alert and cooperative. Normal mood and affect.  Imaging Studies: None  Assessment and Plan:   Denise Tate is a 23 y.o. female with no significant past medical history seen in consultation for chronic constipation, abdominal bloating and lower abdominal pain.  Her diet is totally devoid of vegetables which has fiber  Counseled her about importance of high-fiber foods to manage constipation, information provided Also, discussed with her about soluble fiber such as Benefiber, Citrucel or Metamucil Handout for stool softeners provided Will perform H. pylori breath test   Follow up in 4 to 6 weeks   Arlyss Repressohini R Wilbon Obenchain, MD

## 2019-04-06 NOTE — Patient Instructions (Signed)

## 2019-04-07 ENCOUNTER — Telehealth: Payer: Self-pay | Admitting: Maternal Newborn

## 2019-04-07 LAB — H. PYLORI BREATH TEST: H pylori Breath Test: NEGATIVE

## 2019-04-07 NOTE — Telephone Encounter (Signed)
I would think that it is the Solosec that needs a PA, but I haven't seen any paperwork related to it.

## 2019-04-07 NOTE — Telephone Encounter (Signed)
[facility] referring for Persistent BV infections, requesting Miguel Rota at [facility]. Phone number on file is not accepting calls.

## 2019-04-08 NOTE — Telephone Encounter (Signed)
I called pt's pharmacy, [facility] [address] they didn't see anything about a PA.  The solesec needed one but it was purchased on the 8th for $25. Msg closed

## 2019-05-18 ENCOUNTER — Ambulatory Visit: Payer: BLUE CROSS/BLUE SHIELD | Admitting: Gastroenterology

## 2019-05-18 ENCOUNTER — Encounter: Payer: Self-pay | Admitting: Gastroenterology

## 2019-05-18 ENCOUNTER — Other Ambulatory Visit: Payer: Self-pay

## 2019-05-18 VITALS — BP 98/67 | HR 86 | Temp 98.5°F | Wt 96.4 lb

## 2019-05-18 DIAGNOSIS — R140 Abdominal distension (gaseous): Secondary | ICD-10-CM

## 2019-05-18 DIAGNOSIS — K5909 Other constipation: Secondary | ICD-10-CM

## 2019-05-18 NOTE — Progress Notes (Signed)
Denise Repress, MD [address]  [address]  [place], [place] [address]  Main: [phone]  Fax: [phone]    Gastroenterology Consultation  Referring Provider:     Particia Tate,* Primary Care Physician:  Denise Nearing, PA-C Primary Gastroenterologist:  Dr. Arlyss Tate Reason for Consultation:     Constipation, abdominal bloating        HPI:   Denise Tate is a 23 y.o. female referred by Dr. Maurice Tate, Denise Hews, PA-C  for consultation & management of chronic constipation abdominal bloating.  Patient reports that she has been suffering from constipation as a child.  For the last 6 months, her symptoms including bloating, lower abdominal pain and constipation have gotten worse.  She reports that she does not like eating vegetables and she almost completely eliminate vegetables from her diet.  She likes to eat fruits, mostly eat rice, bread and meat daily.  Her bloating is postprandial and so is abdominal pain.  She does have sensation of incomplete emptying.  Her stools are hard balls and has bowel movement every 3 days.  She has not tried any medications for constipation.  She denies weight loss, nausea or vomiting.  She takes probiotics intermittently as she has been having vaginal yeast infections.  Follow-up visit 05/18/2019 She reports her constipation has somewhat improved but not where she would like it to be.  She has been taking Benefiber chewable tablets 2-3 with each meal, trying to drink more water.  She has noticed that whenever she stays well-hydrated and takes fiber, has a bowel movement that is more formed.  She is eating 1 orange every day when she does not take fiber.  She has not tried any stool softeners.  She does have abdominal bloating.  She has list of questions about gluten intolerance, bacterial overgrowth which were answered.  Patient acknowledges that she notices her bowel movements are harder when she eats less fiber and has less water  intake.  Her weight is stable.  She is planning to join tennis to stay active.  H. pylori breath test was negative Patient was also wondering if she developed bacterial overgrowth secondary to steroids that she was on for eczema in the past.  She was using topical steroid for eczema around her mouth and she would frequently swallow it as a child  NSAIDs: None  Antiplts/Anticoagulants/Anti thrombotics: None  GI Procedures: None She denies family history of GI malignancy  Past Medical History:  Diagnosis Date   Eczema    Oxygen deficiency     History reviewed. No pertinent surgical history.  Current Outpatient Medications:    albuterol (PROVENTIL HFA;VENTOLIN HFA) 108 (90 Base) MCG/ACT inhaler, Inhale 2 puffs into the lungs every 6 (six) hours as needed for wheezing or shortness of breath., Disp: 1 Inhaler, Rfl: 3   clobetasol ointment (TEMOVATE) 0.05 %, Apply 1-2 times a day to affected areas as needed until smooth, repeat if needed. Avoid on face and skin folds., Disp: 30 g, Rfl: 0   hydrocortisone valerate cream (WESTCORT) 0.2 %, Apply 1 application topically 2 (two) times daily., Disp: 45 g, Rfl: 0    Family History  Problem Relation Age of Onset   Hyperlipidemia Father      Social History   Tobacco Use   Smoking status: Never Smoker   Smokeless tobacco: Never Used  Substance Use Topics   Alcohol use: No   Drug use: No    Allergies as of 05/18/2019   (  No Known Allergies)    Review of Systems:    All systems reviewed and negative except where noted in HPI.   Physical Exam:  BP 98/67 (BP Location: Left Arm, Patient Position: Sitting, Cuff Size: Normal)    Pulse 86    Temp 98.5 F (36.9 C) (Oral)    Wt 96 lb 6 oz (43.7 kg)    BMI 18.82 kg/m  No LMP recorded.  General:   Alert,  Well-developed, well-nourished, pleasant and cooperative in NAD Head:  Normocephalic and atraumatic. Eyes:  Sclera clear, no icterus.   Conjunctiva pink. Ears:  Normal auditory  acuity. Nose:  No deformity, discharge, or lesions. Mouth:  No deformity or lesions,oropharynx pink & moist. Neck:  Supple; no masses or thyromegaly. Lungs:  Respirations even and unlabored.  Clear throughout to auscultation.   No wheezes, crackles, or rhonchi. No acute distress. Heart:  Regular rate and rhythm; no murmurs, clicks, rubs, or gallops. Abdomen:  Normal bowel sounds. Soft, non-tender and non-distended without masses, hepatosplenomegaly or hernias noted.  No guarding or rebound tenderness.   Rectal: Not performed Msk:  Symmetrical without gross deformities. Good, equal movement & strength bilaterally. Pulses:  Normal pulses noted. Extremities:  No clubbing or edema.  No cyanosis. Neurologic:  Alert and oriented x3;  grossly normal neurologically. Skin:  Intact without significant lesions or rashes. No jaundice. Psych:  Alert and cooperative. Normal mood and affect.  Imaging Studies: None  Assessment and Plan:   Denise Tate is a 23 y.o. female with no significant past medical history seen for follow-up of chronic constipation, abdominal bloating and lower abdominal pain.  Her diet is totally devoid of vegetables which has fiber.  Constipation somewhat improved by adding fiber and drinking more water  Reiterated to continue high-fiber foods Continue soluble fiber such as Benefiber, Citrucel or Metamucil Start MiraLAX 17 g 1-2 times daily We will check for celiac during next visit if her abdominal bloating is persistent, low clinical suspicion Also, discussed with her about trial of metronidazole for abdominal bloating if it persists Check TSH Encourage patient to communicate with me via my chart as needed  Follow up in 2 months   Denise Darby, MD

## 2019-06-25 IMAGING — US US BREAST*L* LIMITED INC AXILLA
1 series · 7 of 7 positions shown · non-contrast
Comparison: None.

CLINICAL DATA: 22-year-old female with a palpable left breast lump
for approximately 4 months. The patient states this area is not
significantly changed in that time.

EXAM:
ULTRASOUND OF THE LEFT BREAST

[Series 1: us breast*left* limited inc axilla · 0.04mm/px · 7 of 7 slices shown]
[im 1/7]
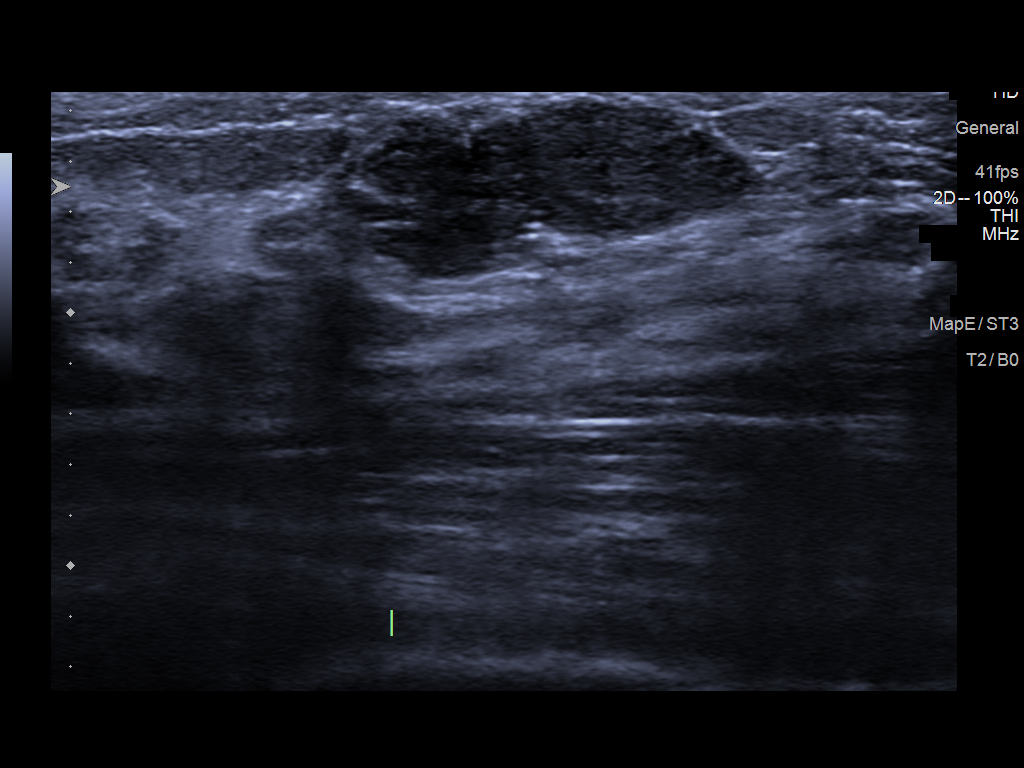
[im 2/7]
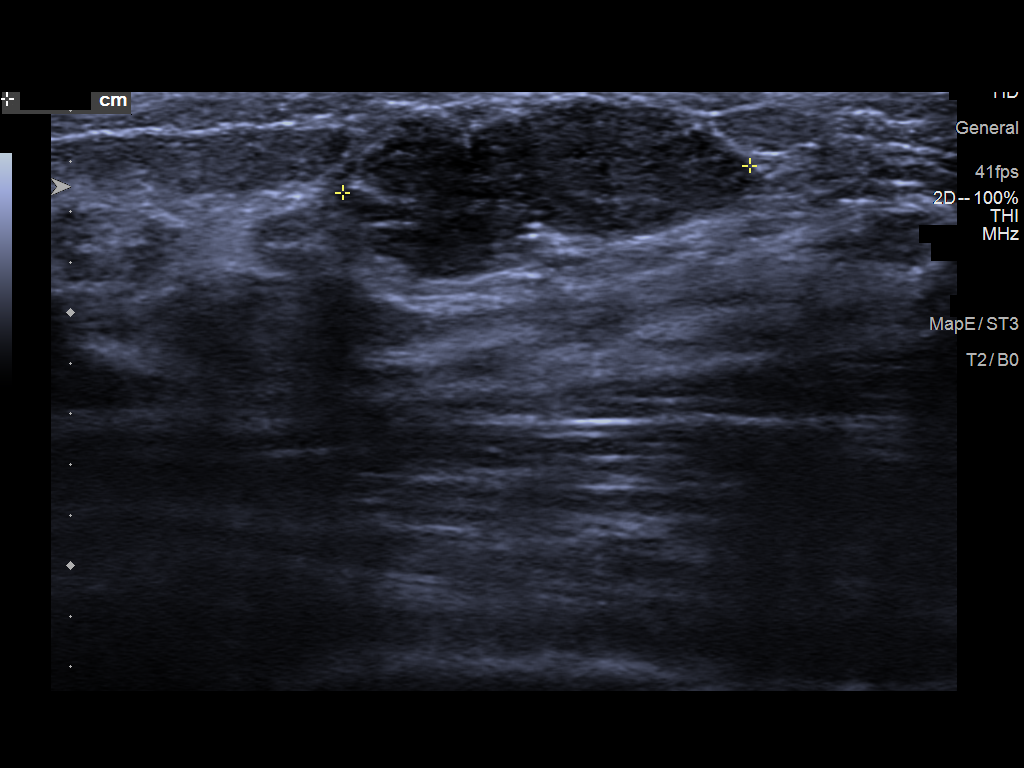
[im 3/7]
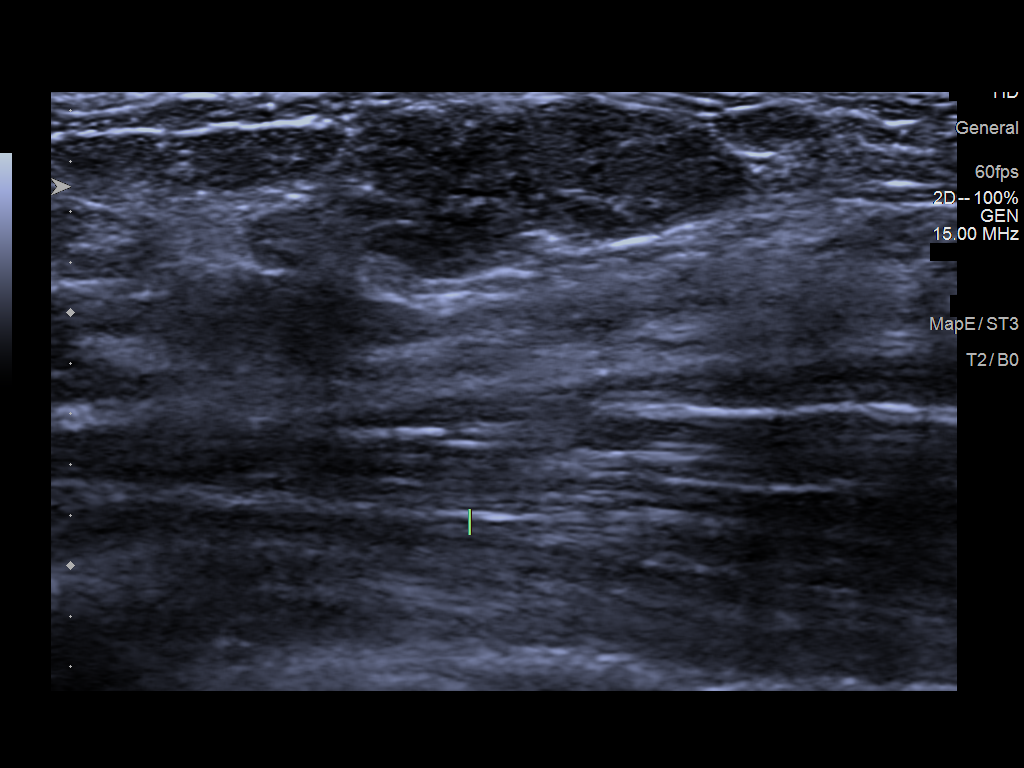
[im 4/7]
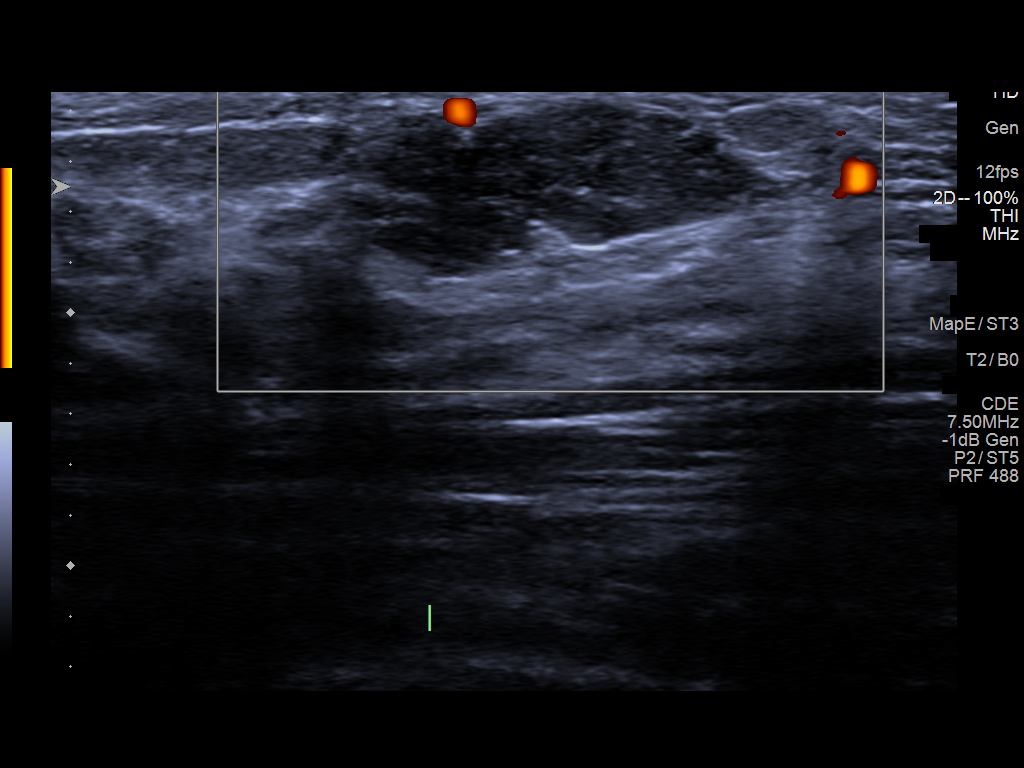
[im 5/7]
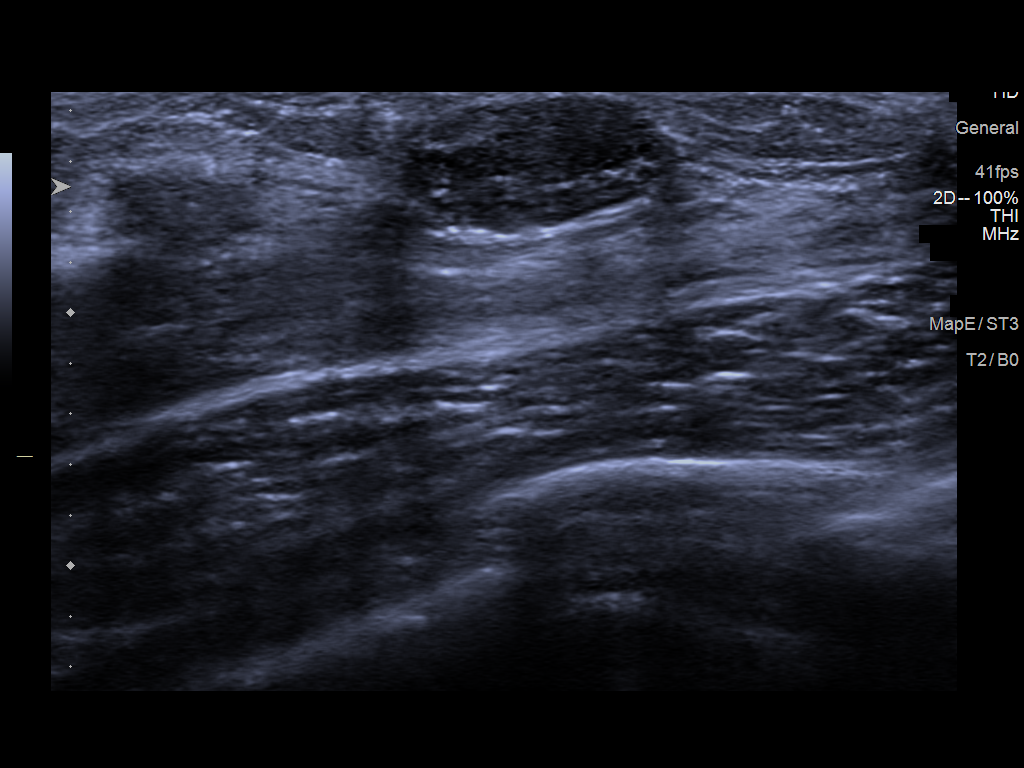
[im 6/7]
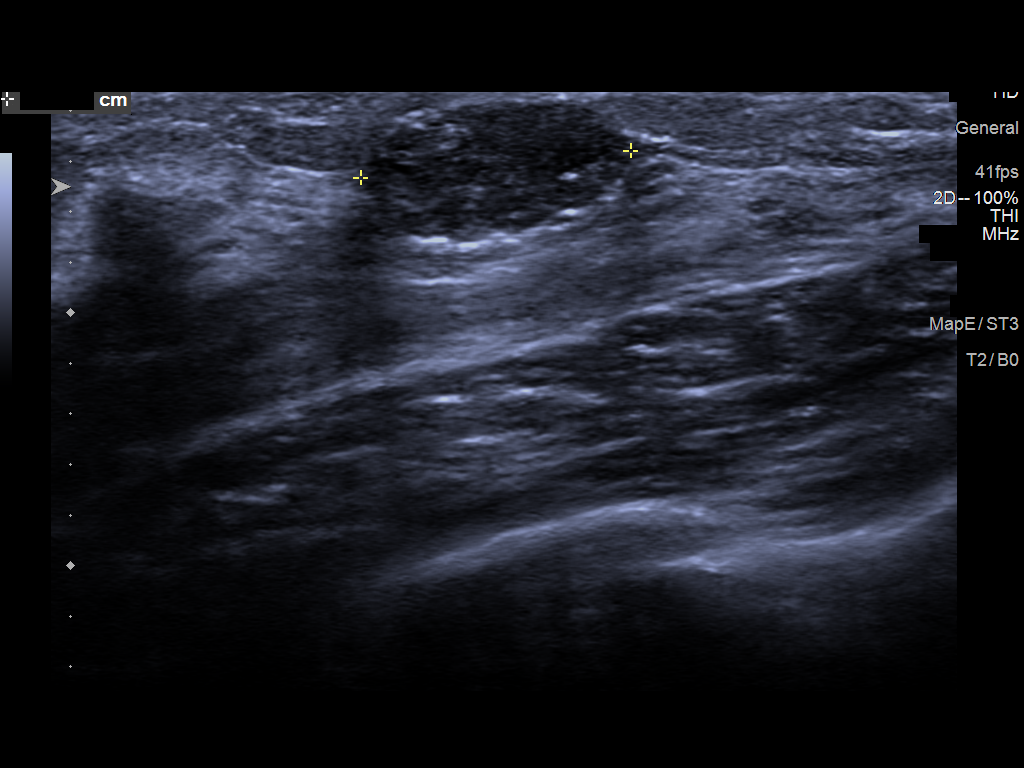
[im 7/7]
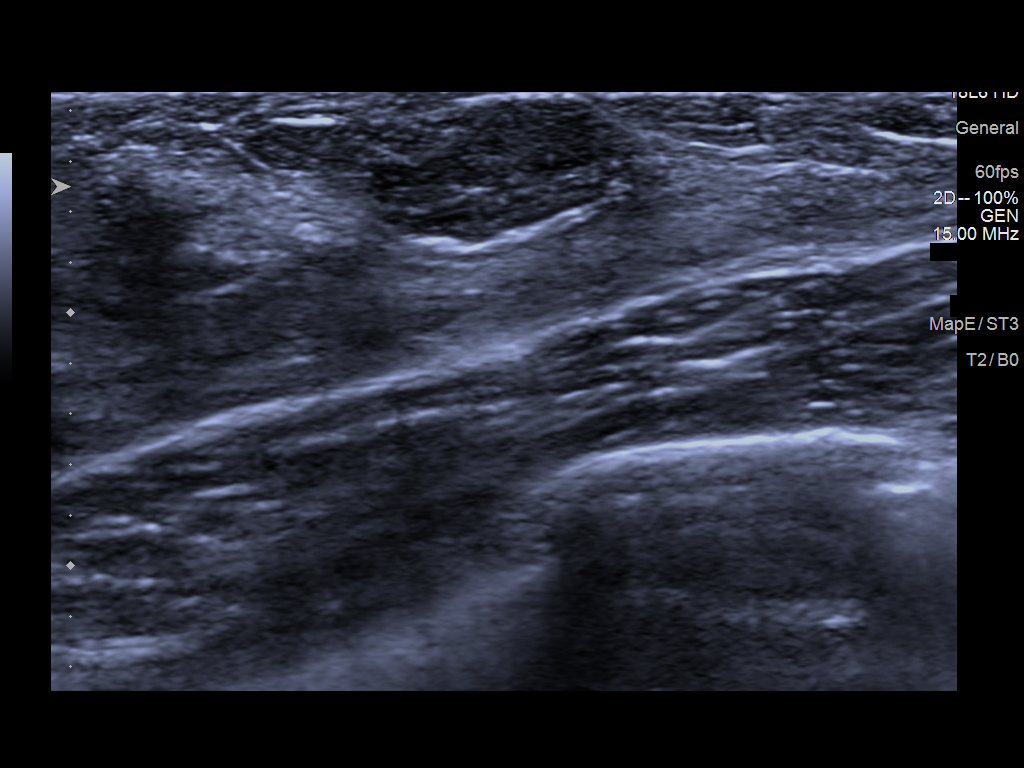

[7 of 7 positions shown; findings below may reference images not displayed]

FINDINGS: On physical exam, I palpate a firm but mobile 1-2 cm lump in the
lower outer periareolar left breast.

Targeted ultrasound is performed, showing an oval, circumscribed
hypoechoic mass at the 4 o'clock position 1 cm from the nipple. It
measures 1.6 x 1.1 x 0.5 cm. There is peripheral vascularity.

Please note, the ultrasound images are incorrectly labeled 3 o'clock
position. The correct location of the masses the 4 o'clock position
1 cm from the nipple.
IMPRESSION: Probably benign, probable left breast fibroadenoma corresponding
with the patient's palpable lump.

RECOMMENDATION:
Six-month ultrasound follow-up is recommended to demonstrate
stability. The patient was instructed to perform monthly self breast
exams, and return sooner if this area enlarges in the interval.

I have discussed the findings and recommendations with the patient.
Results were also provided in writing at the conclusion of the
visit. If applicable, a reminder letter will be sent to the patient
regarding the next appointment.

BI-RADS CATEGORY  3: Probably benign.

## 2019-10-07 ENCOUNTER — Encounter: Payer: Self-pay | Admitting: Family Medicine

## 2019-10-07 ENCOUNTER — Telehealth (INDEPENDENT_AMBULATORY_CARE_PROVIDER_SITE_OTHER): Payer: 59 | Admitting: Family Medicine

## 2019-10-07 ENCOUNTER — Ambulatory Visit: Payer: Self-pay | Admitting: Family Medicine

## 2019-10-07 VITALS — Ht 60.0 in | Wt 94.2 lb

## 2019-10-07 DIAGNOSIS — R41840 Attention and concentration deficit: Secondary | ICD-10-CM

## 2019-10-07 NOTE — Progress Notes (Signed)
Ht 5' (1.524 m)   Wt 94 lb 4 oz (42.8 kg)   BMI 18.41 kg/m    Subjective:    Patient ID: Denise Tate, female    DOB: 1996/01/23, 24 y.o.   MRN: [id number]  HPI: [name] is a 24 y.o. female  Chief Complaint  Patient presents with  . Follow-up    pt would like to discuss about concentration    . This visit was completed via WebEx due to the restrictions of the COVID-19 pandemic. All issues as above were discussed and addressed. Physical exam was done as above through visual confirmation on WebEx. If it was felt that the patient should be evaluated in the office, they were directed there. The patient verbally consented to this visit. . Location of the patient: in parked car . Location of the provider: work . Those involved with this call:  . Provider: Roosvelt Maser, PA-C . CMA: Elton Sin, CMA . Front Desk/Registration: Harriet Pho  . Time spent on call: 15 minutes with patient face to face via video conference. More than 50% of this time was spent in counseling and coordination of care. 5 minutes total spent in review of patient's record and preparation of their chart. I verified patient identity using two factors (patient name and date of birth). Patient consents verbally to being seen via telemedicine visit today.   Patient presenting today concerned about a longstanding hx of concentration issues that is now impacting her ability to succeed in her college program and job. Has always struggled in school, and is now in a self paced college program where she is consistently falling behind, not completing assignments and becoming easily bored and distracted. Also notes on days she's doing a monotonous task at work has a hard time maintaining focus. Has watched videos and done extensive research on strategies to improve focus but none tried have helped. Denies ever trying medication for issue.   Relevant past medical, surgical, family and social history reviewed and updated as  indicated. Interim medical history since our last visit reviewed. Allergies and medications reviewed and updated.  Review of Systems  Per HPI unless specifically indicated above     Objective:    Ht 5' (1.524 m)   Wt 94 lb 4 oz (42.8 kg)   BMI 18.41 kg/m   Wt Readings from Last 3 Encounters:  10/07/19 94 lb 4 oz (42.8 kg)  05/18/19 96 lb 6 oz (43.7 kg)  04/06/19 96 lb 12.8 oz (43.9 kg)    Physical Exam Vitals and nursing note reviewed.  Constitutional:      General: She is not in acute distress.    Appearance: Normal appearance.  HENT:     Head: Atraumatic.     Right Ear: External ear normal.     Left Ear: External ear normal.     Nose: Nose normal. No congestion.     Mouth/Throat:     Mouth: Mucous membranes are moist.     Pharynx: Oropharynx is clear. No posterior oropharyngeal erythema.  Eyes:     Extraocular Movements: Extraocular movements intact.     Conjunctiva/sclera: Conjunctivae normal.  Cardiovascular:     Comments: Unable to assess via virtual visit Pulmonary:     Effort: Pulmonary effort is normal. No respiratory distress.  Musculoskeletal:        General: Normal range of motion.     Cervical back: Normal range of motion.  Skin:    General: Skin is dry.  Findings: No erythema.  Neurological:     Mental Status: She is alert and oriented to person, place, and time.  Psychiatric:        Mood and Affect: Mood normal.        Thought Content: Thought content normal.        Judgment: Judgment normal.     Results for orders placed or performed in visit on 04/06/19  H. pylori breath test  Result Value Ref Range   H pylori Breath Test Negative Negative      Assessment & Plan:   Problem List Items Addressed This Visit    None    Visit Diagnoses    Concentration deficit    -  Primary   Continue working on strategies to improve focus, discussed strattera, intuniv and wellbutrin as initial therapy options. Pt will consider and call with decision        Follow up plan: Return in about 4 weeks (around 11/04/2019) for Concentration f/u.

## 2019-10-14 ENCOUNTER — Other Ambulatory Visit: Payer: Self-pay | Admitting: Family Medicine

## 2019-10-14 MED ORDER — ATOMOXETINE HCL 25 MG PO CAPS: 25.0000 mg | ORAL_CAPSULE | Freq: Every day | ORAL | 0 refills | Status: DC

## 2019-10-18 ENCOUNTER — Encounter: Payer: Self-pay | Admitting: Family Medicine

## 2019-10-18 ENCOUNTER — Other Ambulatory Visit: Payer: Self-pay

## 2019-10-18 ENCOUNTER — Ambulatory Visit (INDEPENDENT_AMBULATORY_CARE_PROVIDER_SITE_OTHER): Payer: 59 | Admitting: Family Medicine

## 2019-10-18 DIAGNOSIS — F988 Other specified behavioral and emotional disorders with onset usually occurring in childhood and adolescence: Secondary | ICD-10-CM | POA: Diagnosis not present

## 2019-10-18 MED ORDER — METHYLPHENIDATE HCL ER (OSM) 18 MG PO TBCR: 18.0000 mg | EXTENDED_RELEASE_TABLET | Freq: Every day | ORAL | 0 refills | Status: DC

## 2019-10-18 NOTE — Progress Notes (Signed)
   BP (!) 92/58   Pulse 80   Temp 98.4 F (36.9 C) (Oral)   Ht 5' (1.524 m)   Wt 98 lb (44.5 kg)   SpO2 100%   BMI 19.14 kg/m    Subjective:    Patient ID: Denise Tate, female    DOB: [birth year], 24 y.o.   MRN: [id number]  HPI: Denise Tate is a 24 y.o. female  Chief Complaint  Patient presents with  . Concentration    pt states that she stopped taking the strattera due to side effects, really sad mood   Patient presenting today for ADD f/u. The Strattera made her incredibly sad, jittery, and unmotivated so she stopped it after 2 days. Still having focus issues and interested in trying other medication. Denies lasting side effects once d/c'd.   Relevant past medical, surgical, family and social history reviewed and updated as indicated. Interim medical history since our last visit reviewed. Allergies and medications reviewed and updated.  Review of Systems  Per HPI unless specifically indicated above     Objective:    BP (!) 92/58   Pulse 80   Temp 98.4 F (36.9 C) (Oral)   Ht 5' (1.524 m)   Wt 98 lb (44.5 kg)   SpO2 100%   BMI 19.14 kg/m   Wt Readings from Last 3 Encounters:  10/18/19 98 lb (44.5 kg)  10/07/19 94 lb 4 oz (42.8 kg)  05/18/19 96 lb 6 oz (43.7 kg)    Physical Exam Vitals and nursing note reviewed.  Constitutional:      Appearance: Normal appearance. She is not ill-appearing.  HENT:     Head: Atraumatic.  Eyes:     Extraocular Movements: Extraocular movements intact.     Conjunctiva/sclera: Conjunctivae normal.  Cardiovascular:     Rate and Rhythm: Normal rate and regular rhythm.     Heart sounds: Normal heart sounds.  Pulmonary:     Effort: Pulmonary effort is normal.     Breath sounds: Normal breath sounds.  Musculoskeletal:        General: Normal range of motion.     Cervical back: Normal range of motion and neck supple.  Skin:    General: Skin is warm and dry.  Neurological:     Mental Status: She is alert and oriented to person,  place, and time.  Psychiatric:        Mood and Affect: Mood normal.        Thought Content: Thought content normal.        Judgment: Judgment normal.     Results for orders placed or performed in visit on 04/06/19  H. pylori breath test  Result Value Ref Range   H pylori Breath Test Negative Negative      Assessment & Plan:   Problem List Items Addressed This Visit      Other   ADD (attention deficit disorder)    Discussed numerous options, pt agreeable to starting low dose concerta. Risks and side effects reviewed at length. F/u in 1 month          Follow up plan: Return in about 4 weeks (around 11/15/2019) for ADD f/u.

## 2019-10-18 NOTE — Assessment & Plan Note (Signed)
Discussed numerous options, pt agreeable to starting low dose concerta. Risks and side effects reviewed at length. F/u in 1 month

## 2019-10-21 ENCOUNTER — Encounter: Payer: Self-pay | Admitting: Family Medicine

## 2019-10-25 ENCOUNTER — Encounter: Payer: Self-pay | Admitting: Family Medicine

## 2019-10-25 ENCOUNTER — Ambulatory Visit (INDEPENDENT_AMBULATORY_CARE_PROVIDER_SITE_OTHER): Payer: 59 | Admitting: Family Medicine

## 2019-10-25 ENCOUNTER — Other Ambulatory Visit: Payer: Self-pay

## 2019-10-25 VITALS — BP 104/68 | HR 93 | Temp 98.2°F

## 2019-10-25 DIAGNOSIS — B9689 Other specified bacterial agents as the cause of diseases classified elsewhere: Secondary | ICD-10-CM

## 2019-10-25 DIAGNOSIS — B373 Candidiasis of vulva and vagina: Secondary | ICD-10-CM

## 2019-10-25 DIAGNOSIS — N760 Acute vaginitis: Secondary | ICD-10-CM | POA: Diagnosis not present

## 2019-10-25 DIAGNOSIS — B3731 Acute candidiasis of vulva and vagina: Secondary | ICD-10-CM

## 2019-10-25 LAB — WET PREP FOR TRICH, YEAST, CLUE
Clue Cell Exam: POSITIVE — AB
Trichomonas Exam: NEGATIVE
Yeast Exam: POSITIVE — AB

## 2019-10-25 MED ORDER — METRONIDAZOLE 500 MG PO TABS: 500.0000 mg | ORAL_TABLET | Freq: Two times a day (BID) | ORAL | 0 refills | Status: DC

## 2019-10-25 MED ORDER — FLUCONAZOLE 150 MG PO TABS: 150.0000 mg | ORAL_TABLET | Freq: Once | ORAL | 0 refills | Status: AC

## 2019-10-25 NOTE — Progress Notes (Signed)
BP 104/68   Pulse 93   Temp 98.2 F (36.8 C) (Oral)   SpO2 98%    Subjective:    Patient ID: Denise Tate, female    DOB: 1996-05-02, 24 y.o.   MRN: [id number]  HPI: Denise Tate is a 24 y.o. female  Chief Complaint  Patient presents with  . Vaginal Itching    burning x about 4 day  . Dysuria   Vaginal irritation for about 4 days, now some itching as well. Denies odor, vaginal discharge, abdominal pain, concern for STIs, N/V/D. Not trying anything OTC for sxs. Hx of BV and yeast infections.   Relevant past medical, surgical, family and social history reviewed and updated as indicated. Interim medical history since our last visit reviewed. Allergies and medications reviewed and updated.  Review of Systems  Per HPI unless specifically indicated above     Objective:    BP 104/68   Pulse 93   Temp 98.2 F (36.8 C) (Oral)   SpO2 98%   Wt Readings from Last 3 Encounters:  10/18/19 98 lb (44.5 kg)  10/07/19 94 lb 4 oz (42.8 kg)  05/18/19 96 lb 6 oz (43.7 kg)    Physical Exam Vitals and nursing note reviewed.  Constitutional:      Appearance: Normal appearance. She is not ill-appearing.  HENT:     Head: Atraumatic.  Eyes:     Extraocular Movements: Extraocular movements intact.     Conjunctiva/sclera: Conjunctivae normal.  Cardiovascular:     Rate and Rhythm: Normal rate and regular rhythm.     Heart sounds: Normal heart sounds.  Pulmonary:     Effort: Pulmonary effort is normal.     Breath sounds: Normal breath sounds.  Abdominal:     General: Bowel sounds are normal. There is no distension.     Palpations: Abdomen is soft.     Tenderness: There is no abdominal tenderness. There is no guarding.  Musculoskeletal:        General: Normal range of motion.     Cervical back: Normal range of motion and neck supple.  Skin:    General: Skin is warm and dry.  Neurological:     Mental Status: She is alert and oriented to person, place, and time.  Psychiatric:       Mood and Affect: Mood normal.        Thought Content: Thought content normal.        Judgment: Judgment normal.     Results for orders placed or performed in visit on 04/06/19  H. pylori breath test  Result Value Ref Range   H pylori Breath Test Negative Negative      Assessment & Plan:   Problem List Items Addressed This Visit    None    Visit Diagnoses    BV (bacterial vaginosis)    -  Primary   Tx with flagyl, probiotics, good vaginal hygiene. F/u if not improving   Relevant Medications   metroNIDAZOLE (FLAGYL) 500 MG tablet   fluconazole (DIFLUCAN) 150 MG tablet   Other Relevant Orders   UA/M w/rflx Culture, Routine   Vaginal candidiasis       Tx with diflucan, f/u if not resolving   Relevant Medications   metroNIDAZOLE (FLAGYL) 500 MG tablet   fluconazole (DIFLUCAN) 150 MG tablet   Other Relevant Orders   WET PREP FOR TRICH, YEAST, CLUE       Follow up plan: Return if symptoms worsen or fail to  improve.

## 2019-10-27 ENCOUNTER — Ambulatory Visit: Payer: 59 | Admitting: Family Medicine

## 2019-10-27 LAB — URINE CULTURE, REFLEX

## 2019-10-27 LAB — UA/M W/RFLX CULTURE, ROUTINE
Bilirubin, UA: NEGATIVE
Glucose, UA: NEGATIVE
Ketones, UA: NEGATIVE
Nitrite, UA: NEGATIVE
Protein,UA: NEGATIVE
RBC, UA: NEGATIVE
Specific Gravity, UA: >1.03 — ABNORMAL HIGH (ref 1.005–1.030)
Urobilinogen, Ur: 0.2 mg/dL (ref 0.2–1.0)
pH, UA: 6 (ref 5.0–7.5)

## 2019-10-27 LAB — MICROSCOPIC EXAMINATION: RBC, Urine: NONE SEEN /hpf (ref 0–2)

## 2019-11-11 ENCOUNTER — Other Ambulatory Visit: Payer: Self-pay

## 2019-11-11 ENCOUNTER — Ambulatory Visit (INDEPENDENT_AMBULATORY_CARE_PROVIDER_SITE_OTHER): Payer: 59 | Admitting: Family Medicine

## 2019-11-11 ENCOUNTER — Encounter: Payer: Self-pay | Admitting: Family Medicine

## 2019-11-11 VITALS — BP 93/60 | HR 87 | Temp 98.0°F | Ht 60.0 in | Wt 96.0 lb

## 2019-11-11 DIAGNOSIS — F988 Other specified behavioral and emotional disorders with onset usually occurring in childhood and adolescence: Secondary | ICD-10-CM | POA: Diagnosis not present

## 2019-11-11 DIAGNOSIS — N941 Unspecified dyspareunia: Secondary | ICD-10-CM | POA: Diagnosis not present

## 2019-11-11 LAB — UA/M W/RFLX CULTURE, ROUTINE
Bilirubin, UA: NEGATIVE
Glucose, UA: NEGATIVE
Ketones, UA: NEGATIVE
Leukocytes,UA: NEGATIVE
Nitrite, UA: NEGATIVE
Protein,UA: NEGATIVE
RBC, UA: NEGATIVE
Specific Gravity, UA: 1.025 (ref 1.005–1.030)
Urobilinogen, Ur: 0.2 mg/dL (ref 0.2–1.0)
pH, UA: 5 (ref 5.0–7.5)

## 2019-11-11 LAB — WET PREP FOR TRICH, YEAST, CLUE
Clue Cell Exam: POSITIVE — AB
Trichomonas Exam: NEGATIVE
Yeast Exam: NEGATIVE

## 2019-11-11 MED ORDER — AMPHETAMINE-DEXTROAMPHET ER 10 MG PO CP24: 10.0000 mg | ORAL_CAPSULE | Freq: Every day | ORAL | 0 refills | Status: DC

## 2019-11-11 NOTE — Progress Notes (Signed)
BP 93/60   Pulse 87   Temp 98 F (36.7 C) (Oral)   Ht 5' (1.524 m)   Wt 96 lb (43.5 kg)   SpO2 98%   BMI 18.75 kg/m    Subjective:    Patient ID: Denise Tate, female    DOB: Oct 23, 1995, 24 y.o.   MRN: [id number]  HPI: Denise Tate is a 24 y.o. female  Chief Complaint  Patient presents with  . ADHD  . Pain    pt states that she has been having painful sexual intercourse x about 2 weeks   ADD - wanting to try vyvanse, didn't tolerate concerta well but heard good things about vyvanse. Still feeling very scattered, having trouble both at work and with school right now. Also notes her boyfriend complains that she cannot focus through a conversation and will often spout random thoughts during a conversation.   Also concerned about painful intercourse the past 2 weeks. Concerned about endometriosis because of painful periods for years. Not interested in birth control. Does have a hx of BV, including recently treated infection. No current overt sxs of that, exposures to STIs, concern for pregnancy, new soaps or products.   Relevant past medical, surgical, family and social history reviewed and updated as indicated. Interim medical history since our last visit reviewed. Allergies and medications reviewed and updated.  Review of Systems  Per HPI unless specifically indicated above     Objective:    BP 93/60   Pulse 87   Temp 98 F (36.7 C) (Oral)   Ht 5' (1.524 m)   Wt 96 lb (43.5 kg)   SpO2 98%   BMI 18.75 kg/m   Wt Readings from Last 3 Encounters:  11/11/19 96 lb (43.5 kg)  10/18/19 98 lb (44.5 kg)  10/07/19 94 lb 4 oz (42.8 kg)    Physical Exam Vitals and nursing note reviewed.  Constitutional:      Appearance: Normal appearance. She is not ill-appearing.  HENT:     Head: Atraumatic.  Eyes:     Extraocular Movements: Extraocular movements intact.     Conjunctiva/sclera: Conjunctivae normal.  Cardiovascular:     Rate and Rhythm: Normal rate and regular rhythm.      Heart sounds: Normal heart sounds.  Pulmonary:     Effort: Pulmonary effort is normal.     Breath sounds: Normal breath sounds.  Abdominal:     General: Bowel sounds are normal. There is no distension.     Palpations: Abdomen is soft.     Tenderness: There is no abdominal tenderness.  Musculoskeletal:        General: Normal range of motion.     Cervical back: Normal range of motion and neck supple.  Skin:    General: Skin is warm and dry.  Neurological:     Mental Status: She is alert and oriented to person, place, and time.  Psychiatric:        Mood and Affect: Mood normal.        Thought Content: Thought content normal.        Judgment: Judgment normal.     Results for orders placed or performed in visit on 11/11/19  WET PREP FOR TRICH, YEAST, CLUE   Specimen: Vaginal; Sterile Swab   STERILE SWAB  Result Value Ref Range   Trichomonas Exam Negative Negative   Yeast Exam Negative Negative   Clue Cell Exam Positive (A) Negative  UA/M w/rflx Culture, Routine   Specimen: Urine  URINE  Result Value Ref Range   Specific Gravity, UA 1.025 1.005 - 1.030   pH, UA 5.0 5.0 - 7.5   Color, UA Yellow Yellow   Appearance Ur Clear Clear   Leukocytes,UA Negative Negative   Protein,UA Negative Negative/Trace   Glucose, UA Negative Negative   Ketones, UA Negative Negative   RBC, UA Negative Negative   Bilirubin, UA Negative Negative   Urobilinogen, Ur 0.2 0.2 - 1.0 mg/dL   Nitrite, UA Negative Negative      Assessment & Plan:   Problem List Items Addressed This Visit      Other   ADD (attention deficit disorder)    Concern about cost of vyvanse, so will try adderall initially. She is very concerned about things she's read on the adderall though so wary about taking. If not tolerated, will send in vyvanse 30 mg for her to try       Other Visit Diagnoses    Dyspareunia, female    -  Primary   Will recheck wet prep, refer to GYN for further evaluation. Declines birth  control at this time. She will msg back with who she'd like to be referred to   Relevant Orders   UA/M w/rflx Culture, Routine (Completed)   WET PREP FOR [place], [place] (Completed)       Follow up plan: Return in about 4 weeks (around 12/09/2019) for ADD f/u.

## 2019-11-12 ENCOUNTER — Other Ambulatory Visit: Payer: Self-pay | Admitting: Family Medicine

## 2019-11-12 MED ORDER — METRONIDAZOLE 0.75 % VA GEL: 1.0000 | Freq: Two times a day (BID) | VAGINAL | 0 refills | Status: AC

## 2019-11-12 NOTE — Assessment & Plan Note (Signed)
Concern about cost of vyvanse, so will try adderall initially. She is very concerned about things she's read on the adderall though so wary about taking. If not tolerated, will send in vyvanse 30 mg for her to try

## 2019-11-13 ENCOUNTER — Encounter: Payer: Self-pay | Admitting: Family Medicine

## 2019-11-13 DIAGNOSIS — N809 Endometriosis, unspecified: Secondary | ICD-10-CM

## 2019-11-13 DIAGNOSIS — N941 Unspecified dyspareunia: Secondary | ICD-10-CM

## 2019-11-17 ENCOUNTER — Ambulatory Visit: Payer: 59 | Admitting: Family Medicine

## 2019-11-22 ENCOUNTER — Other Ambulatory Visit: Payer: Self-pay | Admitting: Family Medicine

## 2019-11-22 ENCOUNTER — Encounter: Payer: Self-pay | Admitting: Family Medicine

## 2019-11-22 MED ORDER — LISDEXAMFETAMINE DIMESYLATE 30 MG PO CAPS: 30.0000 mg | ORAL_CAPSULE | Freq: Every day | ORAL | 0 refills | Status: DC

## 2019-12-13 ENCOUNTER — Encounter: Payer: Self-pay | Admitting: Family Medicine

## 2019-12-13 ENCOUNTER — Ambulatory Visit (INDEPENDENT_AMBULATORY_CARE_PROVIDER_SITE_OTHER): Payer: 59 | Admitting: Family Medicine

## 2019-12-13 ENCOUNTER — Other Ambulatory Visit: Payer: Self-pay

## 2019-12-13 VITALS — BP 91/59 | HR 92 | Temp 98.0°F | Wt 95.0 lb

## 2019-12-13 DIAGNOSIS — F988 Other specified behavioral and emotional disorders with onset usually occurring in childhood and adolescence: Secondary | ICD-10-CM

## 2019-12-13 DIAGNOSIS — N941 Unspecified dyspareunia: Secondary | ICD-10-CM | POA: Diagnosis not present

## 2019-12-13 DIAGNOSIS — L309 Dermatitis, unspecified: Secondary | ICD-10-CM

## 2019-12-13 MED ORDER — LISDEXAMFETAMINE DIMESYLATE 30 MG PO CAPS: 30.0000 mg | ORAL_CAPSULE | Freq: Every day | ORAL | 0 refills | Status: DC

## 2019-12-13 MED ORDER — CLOBETASOL PROPIONATE 0.05 % EX OINT: TOPICAL_OINTMENT | CUTANEOUS | 1 refills | Status: AC

## 2019-12-13 NOTE — Progress Notes (Signed)
BP (!) 91/59   Pulse 92   Temp 98 F (36.7 C) (Oral)   Wt 95 lb (43.1 kg)   SpO2 99%   BMI 18.55 kg/m    Subjective:    Patient ID: Denise Tate, female    DOB: 18-May-1996, 24 y.o.   MRN: [id number]  HPI: Denise Tate is a 24 y.o. female  Chief Complaint  Patient presents with  . ADHD  . Menstrual Problem    very painful  . Medication Refill    clobetasol ointment   Presenting today for several concerns.   The vyvanse does work well for her, but notes if she's not in a good environment the medication won't work. No side effects noted and overall feels a big benefit.   Having continued heavy and painful periods, typically irregular. Still not interested in hormone therapy, and waiting to establish with GYN for further evaluation on this matter. Taking ibuprofen and tylenol prn with mild temporary relief. Having dyspareunia as well.   Needing a refill on her clobetasol for eczema spot treatment. Skin currently doing fairly well.   Relevant past medical, surgical, family and social history reviewed and updated as indicated. Interim medical history since our last visit reviewed. Allergies and medications reviewed and updated.  Review of Systems  Per HPI unless specifically indicated above     Objective:    BP (!) 91/59   Pulse 92   Temp 98 F (36.7 C) (Oral)   Wt 95 lb (43.1 kg)   SpO2 99%   BMI 18.55 kg/m   Wt Readings from Last 3 Encounters:  12/13/19 95 lb (43.1 kg)  11/11/19 96 lb (43.5 kg)  10/18/19 98 lb (44.5 kg)    Physical Exam Vitals and nursing note reviewed.  Constitutional:      Appearance: Normal appearance. She is not ill-appearing.  HENT:     Head: Atraumatic.  Eyes:     Extraocular Movements: Extraocular movements intact.     Conjunctiva/sclera: Conjunctivae normal.  Cardiovascular:     Rate and Rhythm: Normal rate and regular rhythm.     Heart sounds: Normal heart sounds.  Pulmonary:     Effort: Pulmonary effort is normal.     Breath  sounds: Normal breath sounds.  Musculoskeletal:        General: Normal range of motion.     Cervical back: Normal range of motion and neck supple.  Skin:    General: Skin is warm and dry.     Comments: No current rashes noted  Neurological:     Mental Status: She is alert and oriented to person, place, and time.  Psychiatric:        Mood and Affect: Mood normal.        Thought Content: Thought content normal.        Judgment: Judgment normal.     Results for orders placed or performed in visit on 11/11/19  WET PREP FOR TRICH, YEAST, CLUE   Specimen: Vaginal; Sterile Swab   STERILE SWAB  Result Value Ref Range   Trichomonas Exam Negative Negative   Yeast Exam Negative Negative   Clue Cell Exam Positive (A) Negative  UA/M w/rflx Culture, Routine   Specimen: Urine   URINE  Result Value Ref Range   Specific Gravity, UA 1.025 1.005 - 1.030   pH, UA 5.0 5.0 - 7.5   Color, UA Yellow Yellow   Appearance Ur Clear Clear   Leukocytes,UA Negative Negative   Protein,UA Negative  Negative/Trace   Glucose, UA Negative Negative   Ketones, UA Negative Negative   RBC, UA Negative Negative   Bilirubin, UA Negative Negative   Urobilinogen, Ur 0.2 0.2 - 1.0 mg/dL   Nitrite, UA Negative Negative      Assessment & Plan:   Problem List Items Addressed This Visit      Musculoskeletal and Integument   Eczema    Stable on prn steroid cream, continue current regimen        Other   ADD (attention deficit disorder) - Primary    Overall, under better control with vyvanse. Continue current regimen       Other Visit Diagnoses    Dyspareunia, female       Will msg on mychart when deciding where she would like to establish for GYN care. In meantime, continue pelvic floor exercises and monitoring       Follow up plan: Return in about 3 months (around 03/13/2020) for ADD f/u.

## 2019-12-15 ENCOUNTER — Encounter: Payer: Self-pay | Admitting: Family Medicine

## 2019-12-19 NOTE — Assessment & Plan Note (Signed)
Overall, under better control with vyvanse. Continue current regimen

## 2019-12-19 NOTE — Assessment & Plan Note (Signed)
Stable on prn steroid cream, continue current regimen

## 2020-03-17 ENCOUNTER — Ambulatory Visit: Payer: 59 | Admitting: Family Medicine

## 2020-04-13 ENCOUNTER — Encounter: Payer: Self-pay | Admitting: Family Medicine

## 2020-04-17 ENCOUNTER — Other Ambulatory Visit: Payer: Self-pay | Admitting: Family Medicine

## 2020-04-17 DIAGNOSIS — N941 Unspecified dyspareunia: Secondary | ICD-10-CM

## 2020-09-06 ENCOUNTER — Encounter: Payer: Self-pay | Admitting: Family Medicine

## 2020-09-06 ENCOUNTER — Ambulatory Visit (INDEPENDENT_AMBULATORY_CARE_PROVIDER_SITE_OTHER): Payer: BLUE CROSS/BLUE SHIELD | Admitting: Family Medicine

## 2020-09-06 ENCOUNTER — Other Ambulatory Visit: Payer: Self-pay

## 2020-09-06 VITALS — BP 99/63 | HR 85 | Temp 97.9°F | Wt 106.2 lb

## 2020-09-06 DIAGNOSIS — F988 Other specified behavioral and emotional disorders with onset usually occurring in childhood and adolescence: Secondary | ICD-10-CM

## 2020-09-06 MED ORDER — LISDEXAMFETAMINE DIMESYLATE 30 MG PO CAPS: 30.0000 mg | ORAL_CAPSULE | Freq: Every day | ORAL | 0 refills | Status: DC

## 2020-09-06 NOTE — Assessment & Plan Note (Signed)
With previous good effect with vyvanse with mild side effects. Will restart given difficulty concentrating at work. F/u in 3 months or sooner if issues.

## 2020-09-06 NOTE — Patient Instructions (Signed)
It was great to see you!  Our plans for today:  - We sent refills of your vyvanse to the pharmacy. Let us know if you don't tolerate this.  - Come back in 3 months for follow up or sooner if you have concerns.   Take care and seek immediate care sooner if you develop any concerns.   Dr. Linwood Dibbles

## 2020-09-06 NOTE — Progress Notes (Signed)
    SUBJECTIVE:   CHIEF COMPLAINT / HPI:   Patient Active Problem List   Diagnosis Date Noted  . ADD (attention deficit disorder) 10/18/2019  . Eczema 06/23/2017  . Asthma 06/23/2017   ADD - Meds: none currently. Previously on vyvanse, was on for about a month with improvement - seeing a therapist. - Last follow up: 11/2019 - had loss of appetite, sleep disturbances but not super bothersome. - Denies GI upset, vision changes, behavior problems - goes to bed around 9-10pm, gets up around 7ish. Some sleep disturbances with vyvanse but able to go back to sleep.  - Weight changes? No - affecting work performance without vyvanse, hard to focus on work or at meetings.    OBJECTIVE:   BP 99/63   Pulse 85   Temp 97.9 F (36.6 C)   Wt 106 lb 3.2 oz (48.2 kg)   SpO2 97%   BMI 20.74 kg/m   Gen: well appearing, in NAD Card: reg rate Lungs: comfortable WOB on RA Ext: WWP, no edema Psych: mood and affect appropriate  ASSESSMENT/PLAN:   ADD (attention deficit disorder) With previous good effect with vyvanse with mild side effects. Will restart given difficulty concentrating at work. F/u in 3 months or sooner if issues.    Denise Laroche, DO

## 2020-09-08 ENCOUNTER — Telehealth: Payer: Self-pay

## 2020-09-08 NOTE — Telephone Encounter (Signed)
PA started for Vyvanse 30 mg through cover my meds

## 2020-09-11 ENCOUNTER — Telehealth: Payer: Self-pay

## 2020-09-11 NOTE — Telephone Encounter (Signed)
Tried calling patient to notify her of approval. No answer and VM not set up, will try to call again later.

## 2020-09-11 NOTE — Telephone Encounter (Signed)
Copied from CRM [id number]. Topic: Quick Communication - See Telephone Encounter >> Sep 11, 2020  9:54 AM Aretta Nip wrote: CRM for notification. See Telephone encounter for: 09/11/20.[employer] calling to state that they have approved / Completed PRIOR  APPROVAL lisdexamfetamine (VYVANSE) 30 MG capsule 30 capsule 0 09/06/2020   Sig - Route: Take 1 capsule (30 mg total) by mouth daily. - Oral  Sent to pharmacy as: lisdexamfetamine (VYVANSE) 30 MG capsule  Earliest Fill Date: 09/06/2020

## 2020-09-12 NOTE — Telephone Encounter (Signed)
Patient notified of approval. 

## 2020-09-17 ENCOUNTER — Encounter: Payer: Self-pay | Admitting: Family Medicine

## 2020-09-18 ENCOUNTER — Telehealth: Payer: Self-pay

## 2020-09-18 NOTE — Telephone Encounter (Signed)
PA for Vyvanse initiated and submitted via Cover My Meds. Key: [id number]

## 2020-09-19 ENCOUNTER — Other Ambulatory Visit: Payer: Self-pay | Admitting: Family Medicine

## 2020-09-19 MED ORDER — AMPHETAMINE-DEXTROAMPHET ER 10 MG PO CP24: 10.0000 mg | ORAL_CAPSULE | Freq: Every day | ORAL | 0 refills | Status: DC

## 2020-09-20 ENCOUNTER — Other Ambulatory Visit: Payer: Self-pay | Admitting: Family Medicine

## 2020-09-20 MED ORDER — AMPHETAMINE-DEXTROAMPHET ER 10 MG PO CP24: 10.0000 mg | ORAL_CAPSULE | Freq: Every day | ORAL | 0 refills | Status: AC

## 2020-09-22 NOTE — Telephone Encounter (Signed)
PA denied and patient aware. Changed to Adderall XR.

## 2020-11-07 ENCOUNTER — Telehealth: Payer: Self-pay

## 2020-11-07 NOTE — Telephone Encounter (Signed)
Pt called back about appt. Call from [place] but refused to make appt. At this time due to insurance being oon.

## 2020-11-07 NOTE — Telephone Encounter (Signed)
Called pt to see about scheduling f/u [employer] non participating no answer vm not set up
